# Patient Record
Sex: Female | Born: 1935 | Race: White | Hispanic: No | Marital: Single | Smoking: Never smoker
Health system: Southern US, Community
[De-identification: ages and names within clinical notes are randomized; demographics above are authoritative.]

## PROBLEM LIST (undated history)

## (undated) DIAGNOSIS — E785 Hyperlipidemia, unspecified: Secondary | ICD-10-CM

## (undated) DIAGNOSIS — I10 Essential (primary) hypertension: Secondary | ICD-10-CM

## (undated) DIAGNOSIS — C4490 Unspecified malignant neoplasm of skin, unspecified: Secondary | ICD-10-CM

---

## 2013-10-03 DIAGNOSIS — I219 Acute myocardial infarction, unspecified: Secondary | ICD-10-CM

## 2013-10-03 HISTORY — DX: Acute myocardial infarction, unspecified: I21.9

## 2021-11-24 ENCOUNTER — Encounter (HOSPITAL_COMMUNITY): Payer: Self-pay | Admitting: Cardiology

## 2021-11-24 ENCOUNTER — Emergency Department (HOSPITAL_BASED_OUTPATIENT_CLINIC_OR_DEPARTMENT_OTHER): Payer: Federal, State, Local not specified - PPO

## 2021-11-24 ENCOUNTER — Observation Stay (HOSPITAL_COMMUNITY)
Admission: EM | Admit: 2021-11-24 | Discharge: 2021-11-25 | Disposition: A | Discharge status: Home / Self Care | Payer: Federal, State, Local not specified - PPO | Attending: Internal Medicine | Admitting: Internal Medicine

## 2021-11-24 ENCOUNTER — Other Ambulatory Visit: Payer: Self-pay

## 2021-11-24 ENCOUNTER — Emergency Department (HOSPITAL_COMMUNITY): Payer: Federal, State, Local not specified - PPO

## 2021-11-24 DIAGNOSIS — I10 Essential (primary) hypertension: Secondary | ICD-10-CM | POA: Diagnosis not present

## 2021-11-24 DIAGNOSIS — Z7982 Long term (current) use of aspirin: Secondary | ICD-10-CM | POA: Insufficient documentation

## 2021-11-24 DIAGNOSIS — Z79899 Other long term (current) drug therapy: Secondary | ICD-10-CM | POA: Diagnosis not present

## 2021-11-24 DIAGNOSIS — R0989 Other specified symptoms and signs involving the circulatory and respiratory systems: Secondary | ICD-10-CM | POA: Diagnosis present

## 2021-11-24 DIAGNOSIS — I4891 Unspecified atrial fibrillation: Secondary | ICD-10-CM

## 2021-11-24 DIAGNOSIS — Z85828 Personal history of other malignant neoplasm of skin: Secondary | ICD-10-CM | POA: Insufficient documentation

## 2021-11-24 DIAGNOSIS — Z20822 Contact with and (suspected) exposure to covid-19: Secondary | ICD-10-CM | POA: Insufficient documentation

## 2021-11-24 DIAGNOSIS — R778 Other specified abnormalities of plasma proteins: Secondary | ICD-10-CM | POA: Diagnosis not present

## 2021-11-24 DIAGNOSIS — I4819 Other persistent atrial fibrillation: Secondary | ICD-10-CM | POA: Diagnosis present

## 2021-11-24 HISTORY — DX: Essential (primary) hypertension: I10

## 2021-11-24 HISTORY — DX: Hyperlipidemia, unspecified: E78.5

## 2021-11-24 HISTORY — DX: Unspecified malignant neoplasm of skin, unspecified: C44.90

## 2021-11-24 LAB — BASIC METABOLIC PANEL
Anion gap: 12 (ref 5–15)
BUN: 20 mg/dL (ref 8–23)
CO2: 26 mmol/L (ref 22–32)
Calcium: 9.3 mg/dL (ref 8.9–10.3)
Chloride: 103 mmol/L (ref 98–111)
Creatinine, Ser: 0.91 mg/dL (ref 0.44–1.00)
GFR, Estimated: 59 mL/min — ABNORMAL LOW (ref >60)
Glucose, Bld: 103 mg/dL — ABNORMAL HIGH (ref 70–99)
Potassium: 3.8 mmol/L (ref 3.5–5.1)
Sodium: 141 mmol/L (ref 135–145)

## 2021-11-24 LAB — ECHOCARDIOGRAM COMPLETE
AR max vel: 2.51 cm2
AV Area VTI: 2.74 cm2
AV Area mean vel: 2.32 cm2
AV Mean grad: 3 mmHg
AV Peak grad: 6.1 mmHg
Ao pk vel: 1.23 m/s
Area-P 1/2: 2.29 cm2
Height: 64 in
MV VTI: 1.81 cm2
S' Lateral: 2.38 cm
Weight: 2320 oz

## 2021-11-24 LAB — RESP PANEL BY RT-PCR (FLU A&B, COVID) ARPGX2
Influenza A by PCR: NEGATIVE
Influenza B by PCR: NEGATIVE
SARS Coronavirus 2 by RT PCR: NEGATIVE

## 2021-11-24 LAB — CBC
HCT: 45.9 % (ref 36.0–46.0)
Hemoglobin: 15.2 g/dL — ABNORMAL HIGH (ref 12.0–15.0)
MCH: 34.3 pg — ABNORMAL HIGH (ref 26.0–34.0)
MCHC: 33.1 g/dL (ref 30.0–36.0)
MCV: 103.6 fL — ABNORMAL HIGH (ref 80.0–100.0)
Platelets: 166 10*3/uL (ref 150–400)
RBC: 4.43 MIL/uL (ref 3.87–5.11)
RDW: 13.1 % (ref 11.5–15.5)
WBC: 7.9 10*3/uL (ref 4.0–10.5)
nRBC: 0 % (ref 0.0–0.2)

## 2021-11-24 LAB — TROPONIN I (HIGH SENSITIVITY)
Troponin I (High Sensitivity): 500 ng/L (ref <18)
Troponin I (High Sensitivity): 469 ng/L (ref <18)

## 2021-11-24 LAB — MAGNESIUM: Magnesium: 2 mg/dL (ref 1.7–2.4)

## 2021-11-24 MED ORDER — AMIODARONE HCL IN DEXTROSE 360-4.14 MG/200ML-% IV SOLN: 30.0000 mg/h | INTRAVENOUS | Status: DC

## 2021-11-24 MED ORDER — HEPARIN BOLUS VIA INFUSION
4000.0000 [IU] | Freq: Once | INTRAVENOUS | Status: AC
  Administered 2021-11-24: 4000 [IU] via INTRAVENOUS
  Filled 2021-11-24: qty 4000

## 2021-11-24 MED ORDER — ADULT MULTIVITAMIN W/MINERALS CH
1.0000 | ORAL_TABLET | Freq: Every day | ORAL | Status: DC
  Administered 2021-11-25: 1 via ORAL
  Filled 2021-11-24: qty 1

## 2021-11-24 MED ORDER — AMIODARONE HCL IN DEXTROSE 360-4.14 MG/200ML-% IV SOLN
60.0000 mg/h | INTRAVENOUS | Status: DC
  Administered 2021-11-24: 60 mg/h via INTRAVENOUS
  Filled 2021-11-24: qty 200

## 2021-11-24 MED ORDER — ONDANSETRON HCL 4 MG PO TABS
4.0000 mg | ORAL_TABLET | Freq: Four times a day (QID) | ORAL | Status: DC | PRN
Start: 2021-11-24 — End: 2021-11-25

## 2021-11-24 MED ORDER — APIXABAN 5 MG PO TABS
5.0000 mg | ORAL_TABLET | Freq: Two times a day (BID) | ORAL | Status: DC
  Administered 2021-11-24 – 2021-11-25 (×2): 5 mg via ORAL
  Filled 2021-11-24 (×2): qty 1

## 2021-11-24 MED ORDER — ACETAMINOPHEN 650 MG RE SUPP: 650.0000 mg | Freq: Four times a day (QID) | RECTAL | Status: DC | PRN

## 2021-11-24 MED ORDER — ASPIRIN 325 MG PO TABS: 325.0000 mg | ORAL_TABLET | Freq: Every day | ORAL | Status: DC

## 2021-11-24 MED ORDER — AMIODARONE HCL 200 MG PO TABS
200.0000 mg | ORAL_TABLET | Freq: Two times a day (BID) | ORAL | Status: DC
  Administered 2021-11-24 – 2021-11-25 (×3): 200 mg via ORAL
  Filled 2021-11-24 (×3): qty 1

## 2021-11-24 MED ORDER — ONDANSETRON HCL 4 MG/2ML IJ SOLN: 4.0000 mg | Freq: Four times a day (QID) | INTRAMUSCULAR | Status: DC | PRN

## 2021-11-24 MED ORDER — AMIODARONE LOAD VIA INFUSION
150.0000 mg | Freq: Once | INTRAVENOUS | Status: AC
  Administered 2021-11-24: 150 mg via INTRAVENOUS
  Filled 2021-11-24: qty 83.34

## 2021-11-24 MED ORDER — ACETAMINOPHEN 325 MG PO TABS: 650.0000 mg | ORAL_TABLET | Freq: Four times a day (QID) | ORAL | Status: DC | PRN

## 2021-11-24 MED ORDER — SODIUM CHLORIDE 0.9 % IV BOLUS
500.0000 mL | Freq: Once | INTRAVENOUS | Status: AC
  Administered 2021-11-24: 500 mL via INTRAVENOUS

## 2021-11-24 MED ORDER — DILTIAZEM LOAD VIA INFUSION
20.0000 mg | Freq: Once | INTRAVENOUS | Status: AC
  Administered 2021-11-24: 20 mg via INTRAVENOUS
  Filled 2021-11-24: qty 20

## 2021-11-24 MED ORDER — HEPARIN (PORCINE) 25000 UT/250ML-% IV SOLN
950.0000 [IU]/h | INTRAVENOUS | Status: DC
Start: 2021-11-24 — End: 2021-11-24
  Administered 2021-11-24: 950 [IU]/h via INTRAVENOUS
  Filled 2021-11-24 (×2): qty 250

## 2021-11-24 MED ORDER — DILTIAZEM HCL-DEXTROSE 125-5 MG/125ML-% IV SOLN (PREMIX)
5.0000 mg/h | INTRAVENOUS | Status: DC
  Administered 2021-11-24: 5 mg/h via INTRAVENOUS
  Filled 2021-11-24: qty 125

## 2021-11-24 MED ORDER — METOPROLOL SUCCINATE ER 25 MG PO TB24
25.0000 mg | ORAL_TABLET | Freq: Every day | ORAL | Status: DC
  Administered 2021-11-25: 25 mg via ORAL
  Filled 2021-11-24: qty 1

## 2021-11-24 NOTE — Progress Notes (Addendum)
Pt admitted from ED, VSS, CHG complete, oriented to unit, Cardiology notified, tele started.   Chrisandra Carota, RN 11/24/2021 6:36 PM

## 2021-11-24 NOTE — H&P (Signed)
Date: 11/24/2021               Patient Name:  Kristie Gillespie MRN: [id number]  DOB: [birth year] Age / Sex: 88 y.o., female   PCP: Pcp, No         Medical Service: Internal Medicine Teaching Service         Attending Physician: Dr. Velna Ochs, MD    First Contact: Delene Ruffini, MD Pager: [phone]  Second Contact: Rick Duff, MD Pager: [phone]       After Hours (After 5p/  First Contact Pager: [phone]  weekends / holidays): Second Contact Pager: [phone]   SUBJECTIVE   Chief Complaint: atrial fibrillation  History of Present Illness:  88 year old female with a hx of HTN, HLD, MI in 2015 managed medically, no hx of CABG or stents, who presents for new onset a-fib. Yesterday she felt a little weak requesting assistance with ambulation and had been experiencing burning in throat ad diaphoresis last night which almost prompted her to present to ED given hx of MI. She was seen in her PCP office today for routine follow up of HTN. She was asymptomatic, denied palpitations, chest pain, or dyspnea. She was noted to be hypertensive and tachycardic during office visit with BP (129-160)/(109-116) and tachycardic up to 160.  EKG was obtained and showed new afib with RVR and concerning ischemic changes. She was given aspirin 325 and transported to ED via EMS to [facility]. Given cardizem by EMS with no improvemetn.  ED Course:  Troponins were noted to be elevated at 500. Started on heparin and cardiology consulted. Given Cardizem bolus with impprovement in HR, but BP drops so she received 500cc NS bolus. Cardizem drip stopped and amio blus and drip ordered.. Patient admitted to medicine service with cardiology as consult.   Meds:  Current Meds  Medication Sig   acetaminophen (TYLENOL) 650 MG CR tablet Take 650 mg by mouth at bedtime.   aspirin EC 81 MG tablet Take 81 mg by mouth daily. Swallow whole.   DM-APAP-CPM (CORICIDIN HBP PO) Take 1 tablet by mouth daily. 10mg    felodipine  (PLENDIL) 10 MG 24 hr tablet Take 20 mg by mouth daily.   fenoprofen (NALFON) 600 MG TABS tablet Take 600 mg by mouth daily.   furosemide (LASIX) 20 MG tablet Take 20 mg by mouth daily.   irbesartan (AVAPRO) 300 MG tablet Take 300 mg by mouth daily.   KLOR-CON M20 20 MEQ tablet Take 20 mEq by mouth daily.   Lactobacillus Rhamnosus, GG, (CULTURELLE PO) Take 1 capsule by mouth daily.   metoprolol tartrate (LOPRESSOR) 50 MG tablet Take 50 mg by mouth 2 (two) times daily.   Multiple Vitamin (MULTIVITAMIN WITH MINERALS) TABS tablet Take 1 tablet by mouth daily.   simvastatin (ZOCOR) 20 MG tablet Take 20 mg by mouth at bedtime.   vitamin B-12 (CYANOCOBALAMIN) 1000 MCG tablet Take 1,000 mcg by mouth daily.    Past Medical History:  Diagnosis Date   HLD (hyperlipidemia)    HTN (hypertension)    MI (myocardial infarction) ([place]) 2015   Managed medically   Skin cancer     History reviewed. No pertinent surgical history.  Social:  Lives With: none Occupation: retired Network engineer  Support: daughter Level of Function: independent PCP: Jossie Ng Substances: none   Allergies: Allergies as of 11/24/2021 - Review Complete 11/24/2021  Allergen Reaction Noted   Diltiazem Nausea Only 11/24/2021   Prednisone Other (See Comments) 11/24/2021  Amoxicillin Rash 11/24/2021   Tetracyclines & related Rash 11/24/2021    Review of Systems: A complete ROS was negative except as per HPI.   OBJECTIVE:   Physical Exam: Blood pressure 101/70, pulse 60, temperature (!) 97.5 F (36.4 C), resp. rate (!) 24, height 5\' 4"  (1.626 m), weight 65.8 kg, SpO2 97 %.  Constitutional: well-appearing female sitting in bed, in no acute distress HENT: normocephalic atraumatic, mucous membranes moist Eyes: conjunctiva non-erythematous Neck: supple Cardiovascular: regular rate and rhythm, no m/r/g Pulmonary/Chest: normal work of breathing on room air, lungs clear to auscultation bilaterally Abdominal: soft,  non-tender, non-distended MSK: normal bulk and tone Neurological: alert & oriented x 3, 5/5 strength in bilateral upper and lower extremities, normal gait Skin: warm and dry Psych: mood and affect appropriate  Labs: CBC    Component Value Date/Time   WBC 7.9 11/24/2021 1038   RBC 4.43 11/24/2021 1038   HGB 15.2 (H) 11/24/2021 1038   HCT 45.9 11/24/2021 1038   PLT 166 11/24/2021 1038   MCV 103.6 (H) 11/24/2021 1038   MCH 34.3 (H) 11/24/2021 1038   MCHC 33.1 11/24/2021 1038   RDW 13.1 11/24/2021 1038     CMP     Component Value Date/Time   NA 141 11/24/2021 1038   K 3.8 11/24/2021 1038   CL 103 11/24/2021 1038   CO2 26 11/24/2021 1038   GLUCOSE 103 (H) 11/24/2021 1038   BUN 20 11/24/2021 1038   CREATININE 0.91 11/24/2021 1038   CALCIUM 9.3 11/24/2021 1038   GFRNONAA 59 (L) 11/24/2021 1038    Imaging: DG Chest Port 1 View  Result Date: 11/24/2021 CLINICAL DATA:  Atrial fibrillation EXAM: PORTABLE CHEST 1 VIEW COMPARISON:  09/11/2018 FINDINGS: The heart size and mediastinal contours are within normal limits. Aortic atherosclerosis. Chronically coarsened interstitial markings bilaterally. No focal airspace consolidation, pleural effusion, or pneumothorax. The visualized skeletal structures are unremarkable. IMPRESSION: 1. No acute cardiopulmonary findings. 2. Chronic bronchitic type lung changes. Electronically Signed   By: Davina Poke D.O.   On: 11/24/2021 10:43   ECHOCARDIOGRAM COMPLETE  Result Date: 11/24/2021    ECHOCARDIOGRAM REPORT   Patient Name:   [facility] Date of Exam: 11/24/2021 Medical Rec #:  [id number]         Height:       64.0 in Accession #:    [id number]        Weight:       145.0 lb Date of Birth:  Jul 13, 1926          BSA:          1.706 m Patient Age:    86 years          BP:           125/111 mmHg Patient Gender: F                 HR:           131 bpm. Exam Location:  Inpatient Procedure: 2D Echo Indications:    Atrial fibrillation  History:         Patient has no prior history of Echocardiogram examinations.                 Previous Myocardial Infarction; Risk Factors:Dyslipidemia and                 Hypertension.  Sonographer:    Arlyss Gandy Referring Phys: [id number] Margie Billet  Sonographer Comments: Supine. IMPRESSIONS  1.  Left ventricular ejection fraction, by estimation, is 60 to 65%. The left ventricle has normal function. The left ventricle has no regional wall motion abnormalities. Left ventricular diastolic parameters are consistent with Grade II diastolic dysfunction (pseudonormalization).  2. Right ventricular systolic function is normal. The right ventricular size is normal. There is normal pulmonary artery systolic pressure. The estimated right ventricular systolic pressure is 81.8 mmHg.  3. The mitral valve is normal in structure. Mild mitral valve regurgitation. No evidence of mitral stenosis. Moderate mitral annular calcification.  4. Tricuspid valve regurgitation is mild to moderate.  5. The aortic valve is normal in structure. Aortic valve regurgitation is not visualized. No aortic stenosis is present.  6. The inferior vena cava is normal in size with greater than 50% respiratory variability, suggesting right atrial pressure of 3 mmHg. FINDINGS  Left Ventricle: Left ventricular ejection fraction, by estimation, is 60 to 65%. The left ventricle has normal function. The left ventricle has no regional wall motion abnormalities. The left ventricular internal cavity size was normal in size. There is  no left ventricular hypertrophy. Left ventricular diastolic parameters are consistent with Grade II diastolic dysfunction (pseudonormalization). Right Ventricle: The right ventricular size is normal. No increase in right ventricular wall thickness. Right ventricular systolic function is normal. There is normal pulmonary artery systolic pressure. The tricuspid regurgitant velocity is 2.23 m/s, and  with an assumed right atrial pressure of 3  mmHg, the estimated right ventricular systolic pressure is 56.3 mmHg. Left Atrium: Left atrial size was normal in size. Right Atrium: Right atrial size was normal in size. Pericardium: There is no evidence of pericardial effusion. Mitral Valve: The mitral valve is normal in structure. Moderate mitral annular calcification. Mild mitral valve regurgitation. No evidence of mitral valve stenosis. MV peak gradient, 8.2 mmHg. The mean mitral valve gradient is 3.0 mmHg. Tricuspid Valve: The tricuspid valve is normal in structure. Tricuspid valve regurgitation is mild to moderate. No evidence of tricuspid stenosis. Aortic Valve: The aortic valve is normal in structure. Aortic valve regurgitation is not visualized. No aortic stenosis is present. Aortic valve mean gradient measures 3.0 mmHg. Aortic valve peak gradient measures 6.1 mmHg. Aortic valve area, by VTI measures 2.74 cm. Pulmonic Valve: The pulmonic valve was normal in structure. Pulmonic valve regurgitation is not visualized. No evidence of pulmonic stenosis. Aorta: The aortic root is normal in size and structure. Venous: The inferior vena cava is normal in size with greater than 50% respiratory variability, suggesting right atrial pressure of 3 mmHg. IAS/Shunts: No atrial level shunt detected by color flow Doppler.  LEFT VENTRICLE PLAX 2D LVIDd:         3.44 cm   Diastology LVIDs:         2.38 cm   LV e' medial:  5.78 cm/s LV PW:         1.20 cm   LV e' lateral: 6.90 cm/s LV IVS:        1.26 cm LVOT diam:     1.90 cm LV SV:         78 LV SV Index:   46 LVOT Area:     2.84 cm  RIGHT VENTRICLE RV Basal diam:  2.68 cm RV Mid diam:    2.28 cm RV S prime:     11.90 cm/s TAPSE (M-mode): 1.3 cm LEFT ATRIUM             Index        RIGHT ATRIUM  Index LA diam:        3.50 cm 2.05 cm/m   RA Area:     15.20 cm LA Vol (A2C):   56.6 ml 33.17 ml/m  RA Volume:   34.20 ml  20.04 ml/m LA Vol (A4C):   48.8 ml 28.60 ml/m LA Biplane Vol: 52.8 ml 30.94 ml/m  AORTIC  VALVE AV Area (Vmax):    2.51 cm AV Area (Vmean):   2.32 cm AV Area (VTI):     2.74 cm AV Vmax:           123.00 cm/s AV Vmean:          88.900 cm/s AV VTI:            0.284 m AV Peak Grad:      6.1 mmHg AV Mean Grad:      3.0 mmHg LVOT Vmax:         109.00 cm/s LVOT Vmean:        72.900 cm/s LVOT VTI:          0.274 m LVOT/AV VTI ratio: 0.96  AORTA Ao Root diam: 2.70 cm Ao Asc diam:  3.00 cm MITRAL VALVE              TRICUSPID VALVE MV Area (PHT): 2.29 cm   TR Peak grad:   19.9 mmHg MV Area VTI:   1.81 cm   TR Vmax:        223.00 cm/s MV Peak grad:  8.2 mmHg MV Mean grad:  3.0 mmHg   SHUNTS MV Vmax:       1.43 m/s   Systemic VTI:  0.27 m MV Vmean:      75.6 cm/s  Systemic Diam: 1.90 cm Candee Furbish MD Electronically signed by Candee Furbish MD Signature Date/Time: 11/24/2021/3:47:43 PM    Final     EKG: personally reviewed my interpretation is normal sinus   ASSESSMENT & PLAN:    Assessment & Plan by Problem: Principal Problem:   A-fib (HCC) Active Problems:   New onset a-fib (HCC)  A fib with diffuse ST-T wave abnormalities HX of MI 2015 medically managed, no stents or CABG.  CHADSVASC 4.  - converted on IV amio and now in NSR. Switched to 200mg  PO BID.  - metop succinate 25mg  for rate control.  - IV heparin in ED switched to Eliquis 5mg  BID. Not recommended she be on aspirin given advanced age.  - elevated troponin peak at 500, trending down now 469 felt to be demand ischemia in the setting of afib with RVR - TSH  HTN  Blood pressure has been stable. Holding felodipine, lasix and irbesartan for now.   HLD - statin therapy  Dispo: Admit patient to Observation with expected length of stay less than 2 midnights.  Signed: Delene Ruffini, MD Internal Medicine Resident PGY-1 Pager: [phone]  11/24/2021, 8:04 PM

## 2021-11-24 NOTE — ED Triage Notes (Signed)
PT BIB [facility] with complaint of new onset of AFIB found during an office visit with doctor

## 2021-11-24 NOTE — Progress Notes (Signed)
Echocardiogram 2D Echocardiogram has been performed.  Arlyss Gandy 11/24/2021, 3:35 PM

## 2021-11-24 NOTE — ED Triage Notes (Signed)
PT was given 325 ASA from provider prior to EMS pick up , PT was then given 10 mg of cardizem while in ambulance.

## 2021-11-24 NOTE — ED Notes (Signed)
Pt found to be in NSR. EKG obtained. MD notified.

## 2021-11-24 NOTE — Progress Notes (Addendum)
Cardiology Consultation:   Patient ID: Kristie Gillespie MRN: [id number]; DOB: [birth year]  Admit date: 11/24/2021 Date of Consult: 11/24/2021  PCP:  Merryl Hacker, No   [facility] Providers Cardiologist:  None   {    Patient Profile:   Kristie Gillespie is a 88 y.o. female with a hx of HTN, CKD stage IIIa, skin cancer, HLD, CAD with a history of MI in 2015 (no stents or CABG) who is being seen 11/24/2021 for the evaluation of Afib with RVR at the request of Dr. Gilford Raid.  History of Present Illness:   Kristie Gillespie is a 88 year old female with above medical history.  Per chart review, patient had an MI in 2015 that was managed medically.  There are no records of MI available.  No documented echocardiograms, ischemic eval's. On interview, patient denies any cardiac history except for 2015 MI.   Patient presented to the ED on 2/22.  Patient had an office visit with her primary care provider earlier today and during that visit, an EKG showed afib with RVR and concerning ischemic changes on EKG. Patient was sent to [facility] ED for further workup.   Labs in the ED showed Na 141, K 3.8, creatinine 0.91, hemoglobin 15.2, WBC 7.9. HSTN 500>>469. CXR showed no acute cardiopulmonary findings. EKG showed atrial fibrillation with a HR of 158. Patient was started on IV heparin, IV diltiazem. Diltiazem was discontinued due to low BP.  On interview, patient denies any symptoms of atrial fibrillation. Reports that she does not feel any different than normal, but was told by her PCP that she had to come to the hospital. Daughter at bedside  reports that the patient has been in her usual state of health and has not been complaining of any symptoms. No recent falls, dizziness, chest pain, palpitations, dizziness. Patient had an MI in 83 while living in [place], was managed medically. Denies any history of heart failure. Has never been diagnosed with afib in the past.   Past Medical History:  Diagnosis Date   HLD  (hyperlipidemia)    HTN (hypertension)    MI (myocardial infarction) ([place]) 2015   Managed medically   Skin cancer     History reviewed. No pertinent surgical history.   Home Medications:  Prior to Admission medications   Not on File    Inpatient Medications: Scheduled Meds:   Continuous Infusions:  amiodarone 60 mg/hr (11/24/21 1349)   Followed by   amiodarone     heparin 950 Units/hr (11/24/21 1351)   PRN Meds:   Allergies:    Allergies  Allergen Reactions   Diltiazem Nausea Only   Prednisone Other (See Comments)   Amoxicillin Rash   Tetracyclines & Related Rash    Social History:   Social History   Socioeconomic History   Marital status: Single    Spouse name: Not on file   Number of children: Not on file   Years of education: Not on file   Highest education level: Not on file  Occupational History   Not on file  Tobacco Use   Smoking status: Not on file   Smokeless tobacco: Not on file  Substance and Sexual Activity   Alcohol use: Not on file   Drug use: Not on file   Sexual activity: Not on file  Other Topics Concern   Not on file  Social History Narrative   Not on file   Social Determinants of Health   Financial Resource Strain: Not on file  Food Insecurity: Not on file  Transportation Needs: Not on file  Physical Activity: Not on file  Stress: Not on file  Social Connections: Not on file  Intimate Partner Violence: Not on file    Family History:   No family history on file.   ROS:  Please see the history of present illness.   All other ROS reviewed and negative.     Physical Exam/Data:   Vitals:   11/24/21 1210 11/24/21 1220 11/24/21 1250 11/24/21 1300  BP: (!) 119/100 114/87 (!) 115/100 118/79  Pulse: (!) 152 91 (!) 131 (!) 159  Resp: (!) 24 20 (!) 31 20  Temp:      SpO2: 95% 96% 97% 96%  Weight:      Height:       No intake or output data in the 24 hours ending 11/24/21 1419 Last 3 Weights 11/24/2021  Weight (lbs) 145  lb  Weight (kg) 65.772 kg     Body mass index is 24.89 kg/m.  General:  Elderly woman in no acute distress HEENT: normal Neck: no JVD Vascular: Radial pulses 2+ bilaterally Cardiac:  normal S1, S2; irregular rate and rhythm, tachycardic; no murmur  Lungs:  clear to auscultation bilaterally, no wheezing, rhonchi or rales  Abd: soft, nontender, no hepatomegaly  Ext: no edema Musculoskeletal:  No deformities Skin: warm and dry  Neuro:  CNs 2-12 intact, no focal abnormalities noted Psych:  Normal affect   EKG:  The EKG was personally reviewed and demonstrates:  EKG showed atrial fibrillation with a HR of 158 Telemetry:  Telemetry was personally reviewed and demonstrates:  Atrial fibrillation, HR in the 140s-160s  Relevant CV Studies:   Laboratory Data:  High Sensitivity Troponin:   Recent Labs  Lab 11/24/21 1038 11/24/21 1220  TROPONINIHS 500* 469*     Chemistry Recent Labs  Lab 11/24/21 1038  NA 141  K 3.8  CL 103  CO2 26  GLUCOSE 103*  BUN 20  CREATININE 0.91  CALCIUM 9.3  MG 2.0  GFRNONAA 59*  ANIONGAP 12    No results for input(s): PROT, ALBUMIN, AST, ALT, ALKPHOS, BILITOT in the last 168 hours. Lipids No results for input(s): CHOL, TRIG, HDL, LABVLDL, LDLCALC, CHOLHDL in the last 168 hours.  Hematology Recent Labs  Lab 11/24/21 1038  WBC 7.9  RBC 4.43  HGB 15.2*  HCT 45.9  MCV 103.6*  MCH 34.3*  MCHC 33.1  RDW 13.1  PLT 166   Thyroid No results for input(s): TSH, FREET4 in the last 168 hours.  BNPNo results for input(s): BNP, PROBNP in the last 168 hours.  DDimer No results for input(s): DDIMER in the last 168 hours.   Radiology/Studies:  DG Chest Port 1 View  Result Date: 11/24/2021 CLINICAL DATA:  Atrial fibrillation EXAM: PORTABLE CHEST 1 VIEW COMPARISON:  09/11/2018 FINDINGS: The heart size and mediastinal contours are within normal limits. Aortic atherosclerosis. Chronically coarsened interstitial markings bilaterally. No focal airspace  consolidation, pleural effusion, or pneumothorax. The visualized skeletal structures are unremarkable. IMPRESSION: 1. No acute cardiopulmonary findings. 2. Chronic bronchitic type lung changes. Electronically Signed   By: Davina Poke D.O.   On: 11/24/2021 10:43     Assessment and Plan:   Atrial Fibrillation with RVR: CHADS-VASc 4 (age x2, gender, HTN)  - HSTN 500>>469, likely demand ischemia in the setting of tachycardia. Will continue to trend troponin - Patient started on IV heparin in the ED. Plan to transition to oral [facility] while admitted  -  Was initially started on IV diltiazem, but the patient has low BP so it was discontinued.  - Unlikely that BP will be able to tolerate BB. Started IV amiodarone bolus and infusion   - Ordered echocardiogram  - Keep on telemetry  - As patient is asymptomatic, unsure how long patient has been in afib. If patient does require a cardioversion, will need TEE prior.  - Will order TSH   HTN  - Patient reports taking metoprolol, irbesartan for BP management  - Holding BP meds in the setting of low BP  - As BP tolerates, plan to add back on medications. Restart metoprolol before restarting irbesartan as metoprolol will help with rate control   HLD  - Continue simvastatin   CAD  - MI in 2015, managed medically  - Continue ASA, statin - BB on hold for now, will restart when able   Risk Assessment/Risk Scores:      CHA2DS2-VASc Score = 4  This indicates a 4.8% annual risk of stroke. The patient's score is based upon: CHF History: 0 HTN History: 1 Diabetes History: 0 Stroke History: 0 Vascular Disease History: 0 Age Score: 2 Gender Score: 1         For questions or updates, please contact [place] Please consult [email or web address] for contact info under    Signed, Margie Billet, PA-C  11/24/2021 2:19 PM

## 2021-11-24 NOTE — Progress Notes (Signed)
ANTICOAGULATION CONSULT NOTE - Initial Consult  Pharmacy Consult for Heparin Indication: atrial fibrillation  Allergies  Allergen Reactions   Diltiazem Nausea Only   Prednisone Other (See Comments)   Amoxicillin Rash   Tetracyclines & Related Rash    Patient Measurements: Height: 5\' 4"  (162.6 cm) Weight: 65.8 kg (145 lb) IBW/kg (Calculated) : 54.7 Heparin Dosing Weight: 65.8 kg   Vital Signs: Temp: 97.5 F (36.4 C) (02/22 1023) BP: 114/87 (02/22 1220) Pulse Rate: 91 (02/22 1220)  Labs: Recent Labs    11/24/21 1038  HGB 15.2*  HCT 45.9  PLT 166  CREATININE 0.91  TROPONINIHS 500*    Estimated Creatinine Clearance: 36 mL/min (by C-G formula based on SCr of 0.91 mg/dL).   Medical History: No past medical history on file.  Assessment: 88 yo female presented on 11/24/2021 with new onset Afib. No AC prior to admission. Hgb 15.2. Plt wnl. Trop 500.   Goal of Therapy:  Heparin level 0.3-0.7 units/ml Monitor platelets by anticoagulation protocol: Yes   Plan:  Heparin 4000 units x1 bolus followed by 950 units/hr  Check 8 hr heparin level Monitor heparin level, CBC and s/s of bleeding   Cristela Felt, PharmD, BCPS Clinical Pharmacist 11/24/2021 12:40 PM

## 2021-11-24 NOTE — ED Provider Notes (Signed)
[facility] EMERGENCY DEPARTMENT Provider Note   CSN: [id number] Arrival date & time: 11/24/21  1014     History  Chief Complaint  Patient presents with   Atrial Fibrillation    Kristie Gillespie is a 88 y.o. female.  Pt is a 88 yo wf with a hx of skin cancer, MI (did not require a stent), high cholesterol, and htn.  She went to the doctor today for a routine f/u for her htn.  She did note that she felt a little weak yesterday and woke up in the night feeling some burning in her throat and was sweaty.  She does not have cp or sensation of palpitations.  Her doctor checked her HR and did an EKG which showed afib.  Pt has no hx of afib.  Her doctor called EMS and EMS gave her 10 mg of cardizem en route which did not help HR very much. Pt does not have a local cardiologist.  Cath for MI done in [place].      Home Medications Prior to Admission medications   Medication Sig Start Date End Date Taking? Authorizing Provider  acetaminophen (TYLENOL) 650 MG CR tablet Take 650 mg by mouth at bedtime.   Yes [provider]  aspirin EC 81 MG tablet Take 81 mg by mouth daily. Swallow whole.   Yes [provider]  DM-APAP-CPM (CORICIDIN HBP PO) Take 1 tablet by mouth daily. 10mg    Yes [provider]  felodipine (PLENDIL) 10 MG 24 hr tablet Take 20 mg by mouth daily. 11/16/21  Yes [provider]  fenoprofen (NALFON) 600 MG TABS tablet Take 600 mg by mouth daily.   Yes [provider]  furosemide (LASIX) 20 MG tablet Take 20 mg by mouth daily. 09/20/21  Yes [provider]  irbesartan (AVAPRO) 300 MG tablet Take 300 mg by mouth daily. 09/17/21  Yes [provider]  KLOR-CON M20 20 MEQ tablet Take 20 mEq by mouth daily. 11/21/21  Yes [provider]  Lactobacillus Rhamnosus, GG, (CULTURELLE PO) Take 1 capsule by mouth daily.   Yes [provider]  metoprolol tartrate (LOPRESSOR) 50 MG tablet Take 50 mg  by mouth 2 (two) times daily. 09/22/21  Yes [provider]  Multiple Vitamin (MULTIVITAMIN WITH MINERALS) TABS tablet Take 1 tablet by mouth daily.   Yes [provider]  simvastatin (ZOCOR) 20 MG tablet Take 20 mg by mouth at bedtime. 11/22/21  Yes [provider]  vitamin B-12 (CYANOCOBALAMIN) 1000 MCG tablet Take 1,000 mcg by mouth daily.   Yes [provider]      Allergies    Diltiazem, Prednisone, Amoxicillin, and Tetracyclines & related    Review of Systems   Review of Systems  Neurological:  Positive for weakness.  All other systems reviewed and are negative.  Physical Exam Updated Vital Signs BP 129/67    Pulse 72    Temp (!) 97.5 F (36.4 C)    Resp 14    Ht 5\' 4"  (1.626 m)    Wt 65.8 kg    SpO2 96%    BMI 24.89 kg/m  Physical Exam Vitals and nursing note reviewed.  Constitutional:      Appearance: Normal appearance.  HENT:     Head: Normocephalic and atraumatic.     Right Ear: External ear normal.     Left Ear: External ear normal.     Nose: Nose normal.     Mouth/Throat:  Mouth: Mucous membranes are moist.     Pharynx: Oropharynx is clear.  Eyes:     Extraocular Movements: Extraocular movements intact.     Conjunctiva/sclera: Conjunctivae normal.     Pupils: Pupils are equal, round, and reactive to light.  Cardiovascular:     Rate and Rhythm: Tachycardia present. Rhythm irregular.     Pulses: Normal pulses.     Heart sounds: Normal heart sounds.  Pulmonary:     Effort: Pulmonary effort is normal.     Breath sounds: Normal breath sounds.  Abdominal:     General: Abdomen is flat. Bowel sounds are normal.     Palpations: Abdomen is soft.  Musculoskeletal:        General: Normal range of motion.     Cervical back: Normal range of motion and neck supple.  Skin:    General: Skin is warm.     Capillary Refill: Capillary refill takes less than 2 seconds.  Neurological:     General: No focal deficit present.     Mental  Status: She is alert and oriented to person, place, and time.  Psychiatric:        Mood and Affect: Mood normal.        Behavior: Behavior normal.    ED Results / Procedures / Treatments   Labs (all labs ordered are listed, but only abnormal results are displayed) Labs Reviewed  BASIC METABOLIC PANEL - Abnormal; Notable for the following components:      Result Value   Glucose, Bld 103 (*)    GFR, Estimated 59 (*)    All other components within normal limits  CBC - Abnormal; Notable for the following components:   Hemoglobin 15.2 (*)    MCV 103.6 (*)    MCH 34.3 (*)    All other components within normal limits  TROPONIN I (HIGH SENSITIVITY) - Abnormal; Notable for the following components:   Troponin I (High Sensitivity) 500 (*)    All other components within normal limits  TROPONIN I (HIGH SENSITIVITY) - Abnormal; Notable for the following components:   Troponin I (High Sensitivity) 469 (*)    All other components within normal limits  RESP PANEL BY RT-PCR (FLU A&B, COVID) ARPGX2  MAGNESIUM  TSH  PROTIME-INR  ALT    EKG EKG Interpretation  Date/Time:  Wednesday November 24 2021 14:45:52 EST Ventricular Rate:  69 PR Interval:  204 QRS Duration: 88 QT Interval:  402 QTC Calculation: 431 R Axis:   42 Text Interpretation: Sinus rhythm Minimal ST depression, lateral leads now in nsr Confirmed by Isla Pence [phone]) on 11/24/2021 2:47:07 PM  Radiology DG Chest Port 1 View  Result Date: 11/24/2021 CLINICAL DATA:  Atrial fibrillation EXAM: PORTABLE CHEST 1 VIEW COMPARISON:  09/11/2018 FINDINGS: The heart size and mediastinal contours are within normal limits. Aortic atherosclerosis. Chronically coarsened interstitial markings bilaterally. No focal airspace consolidation, pleural effusion, or pneumothorax. The visualized skeletal structures are unremarkable. IMPRESSION: 1. No acute cardiopulmonary findings. 2. Chronic bronchitic type lung changes. Electronically Signed    By: Davina Poke D.O.   On: 11/24/2021 10:43    Procedures Procedures    Medications Ordered in ED Medications  metoprolol succinate (TOPROL-XL) 24 hr tablet 25 mg (has no administration in time range)  amiodarone (PACERONE) tablet 200 mg (has no administration in time range)  apixaban (ELIQUIS) tablet 5 mg (has no administration in time range)  diltiazem (CARDIZEM) 1 mg/mL load via infusion 20 mg (20 mg Intravenous Bolus from  Bag 11/24/21 1049)  sodium chloride 0.9 % bolus 500 mL (0 mLs Intravenous Stopped 11/24/21 1352)  heparin bolus via infusion 4,000 Units (4,000 Units Intravenous Bolus from Bag 11/24/21 1351)  amiodarone (NEXTERONE) 1.8 mg/mL load via infusion 150 mg (150 mg Intravenous Bolus from Bag 11/24/21 1346)    ED Course/ Medical Decision Making/ A&P                           Medical Decision Making Amount and/or Complexity of Data Reviewed Labs: ordered. Radiology: ordered.  Risk Prescription drug management. Decision regarding hospitalization.   This patient presents to the ED for concern of new onset a. fib, this involves an extensive number of treatment options, and is a complaint that carries with it a high risk of complications and morbidity.  The differential diagnosis includes cardiovascular disorder, electrolyte abn, mi   Co morbidities that complicate the patient evaluation  Htn, high cholesterol, and hx MI   Additional history obtained:  Additional history obtained from epic chart review External records from outside source obtained and reviewed including daughter   Lab Tests:  I Ordered, and personally interpreted labs.  The pertinent results include:  cbc and bmp nl.  Troponin elevated at 500.   Imaging Studies ordered:  I ordered imaging studies including cxr  I independently visualized and interpreted imaging which showed chronic bronchitis I agree with the radiologist interpretation   Cardiac Monitoring:  The patient was  maintained on a cardiac monitor.  I personally viewed and interpreted the cardiac monitored which showed an underlying rhythm of: afib with rvr   Medicines ordered and prescription drug management:  I ordered medication including cardizem bolus and drip/heparin  for afib  Reevaluation of the patient after these medicines showed that the patient improved I have reviewed the patients home medicines and have made adjustments as needed     Critical Interventions:  Asa given by pcp, heparin started as chadvasc is 5   Consultations Obtained:  I requested consultation with cardiology,  and discussed lab and imaging findings as well as pertinent plan - they recommend: admission to the medicine service.  They will consult. Pt d/w IMTS for admission.   Problem List / ED Course:  Atrial fib:  pt started on cardizem.  It has helped hr, but hr is still elevated.  BP did drop, so she was given 500 cc ns bolus.  Bp remains soft, so we can't titrate cardizem up.  Cardizem stopped and amiodarone bolus and drip was ordered.  Pt is not a candidate for cardioversion as she is unable to tell she is in afib.   Elevated troponin:  Likely demand ischemia.   Reevaluation:  After the interventions noted above, I reevaluated the patient and found that they have :improved     Dispostion:  After consideration of the diagnostic results and the patients response to treatment, I feel that the patent would benefit from admission.  CHA2DS2/VAS Stroke Risk Points; 11 (age, female, hx htn, hx mi)           CRITICAL CARE Performed by: Isla Pence   Total critical care time: 30 minutes  Critical care time was exclusive of separately billable procedures and treating other patients.  Critical care was necessary to treat or prevent imminent or life-threatening deterioration.  Critical care was time spent personally by me on the following activities: development of treatment plan with patient and/or  surrogate as well as nursing,  discussions with consultants, evaluation of patient's response to treatment, examination of patient, obtaining history from patient or surrogate, ordering and performing treatments and interventions, ordering and review of laboratory studies, ordering and review of radiographic studies, pulse oximetry and re-evaluation of patient's condition.         Final Clinical Impression(s) / ED Diagnoses Final diagnoses:  Atrial fibrillation with RVR ([place])  Elevated troponin    Rx / DC Orders ED Discharge Orders     None         Isla Pence, MD 11/24/21 1547

## 2021-11-24 NOTE — Hospital Course (Signed)
° ° °  PMH:  HTN

## 2021-11-25 ENCOUNTER — Other Ambulatory Visit (HOSPITAL_COMMUNITY): Payer: Self-pay

## 2021-11-25 DIAGNOSIS — I480 Paroxysmal atrial fibrillation: Secondary | ICD-10-CM | POA: Diagnosis not present

## 2021-11-25 DIAGNOSIS — I10 Essential (primary) hypertension: Secondary | ICD-10-CM | POA: Diagnosis not present

## 2021-11-25 DIAGNOSIS — R778 Other specified abnormalities of plasma proteins: Secondary | ICD-10-CM | POA: Diagnosis not present

## 2021-11-25 LAB — CBC
HCT: 37 % (ref 36.0–46.0)
Hemoglobin: 12.4 g/dL (ref 12.0–15.0)
MCH: 34.1 pg — ABNORMAL HIGH (ref 26.0–34.0)
MCHC: 33.5 g/dL (ref 30.0–36.0)
MCV: 101.6 fL — ABNORMAL HIGH (ref 80.0–100.0)
Platelets: 131 10*3/uL — ABNORMAL LOW (ref 150–400)
RBC: 3.64 MIL/uL — ABNORMAL LOW (ref 3.87–5.11)
RDW: 13.1 % (ref 11.5–15.5)
WBC: 7.6 10*3/uL (ref 4.0–10.5)
nRBC: 0 % (ref 0.0–0.2)

## 2021-11-25 LAB — COMPREHENSIVE METABOLIC PANEL
ALT: 18 U/L (ref 0–44)
AST: 27 U/L (ref 15–41)
Albumin: 3.3 g/dL — ABNORMAL LOW (ref 3.5–5.0)
Alkaline Phosphatase: 60 U/L (ref 38–126)
Anion gap: 11 (ref 5–15)
BUN: 16 mg/dL (ref 8–23)
CO2: 24 mmol/L (ref 22–32)
Calcium: 8.6 mg/dL — ABNORMAL LOW (ref 8.9–10.3)
Chloride: 105 mmol/L (ref 98–111)
Creatinine, Ser: 0.88 mg/dL (ref 0.44–1.00)
GFR, Estimated: >60 mL/min (ref >60)
Glucose, Bld: 100 mg/dL — ABNORMAL HIGH (ref 70–99)
Potassium: 3.3 mmol/L — ABNORMAL LOW (ref 3.5–5.1)
Sodium: 140 mmol/L (ref 135–145)
Total Bilirubin: 1 mg/dL (ref 0.3–1.2)
Total Protein: 5.4 g/dL — ABNORMAL LOW (ref 6.5–8.1)

## 2021-11-25 LAB — TSH: TSH: 3.169 u[IU]/mL (ref 0.350–4.500)

## 2021-11-25 LAB — VITAMIN B12: Vitamin B-12: 842 pg/mL (ref 180–914)

## 2021-11-25 LAB — FOLATE: Folate: 33.2 ng/mL (ref >5.9)

## 2021-11-25 LAB — MAGNESIUM: Magnesium: 1.8 mg/dL (ref 1.7–2.4)

## 2021-11-25 MED ORDER — POTASSIUM CHLORIDE CRYS ER 20 MEQ PO TBCR
40.0000 meq | EXTENDED_RELEASE_TABLET | Freq: Two times a day (BID) | ORAL | Status: DC
  Administered 2021-11-25: 40 meq via ORAL
  Filled 2021-11-25: qty 2

## 2021-11-25 MED ORDER — ONDANSETRON HCL 4 MG/2ML IJ SOLN
4.0000 mg | Freq: Once | INTRAMUSCULAR | Status: AC
  Administered 2021-11-25: 4 mg via INTRAVENOUS
  Filled 2021-11-25: qty 2

## 2021-11-25 MED ORDER — METOPROLOL SUCCINATE ER 25 MG PO TB24
25.0000 mg | ORAL_TABLET | Freq: Every day | ORAL | 0 refills | Status: DC
  Filled 2021-11-25: qty 30, 30d supply, fill #0

## 2021-11-25 MED ORDER — AMIODARONE HCL 200 MG PO TABS
200.0000 mg | ORAL_TABLET | Freq: Two times a day (BID) | ORAL | 0 refills | Status: DC
  Filled 2021-11-25: qty 60, 30d supply, fill #0

## 2021-11-25 MED ORDER — IRBESARTAN 300 MG PO TABS
300.0000 mg | ORAL_TABLET | Freq: Every day | ORAL | Status: DC
  Administered 2021-11-25: 300 mg via ORAL
  Filled 2021-11-25: qty 1

## 2021-11-25 MED ORDER — HYDRALAZINE HCL 20 MG/ML IJ SOLN
10.0000 mg | Freq: Four times a day (QID) | INTRAMUSCULAR | Status: DC | PRN
  Administered 2021-11-25: 10 mg via INTRAVENOUS
  Filled 2021-11-25: qty 1

## 2021-11-25 MED ORDER — ROSUVASTATIN CALCIUM 5 MG PO TABS
5.0000 mg | ORAL_TABLET | Freq: Every day | ORAL | Status: DC
  Administered 2021-11-25: 5 mg via ORAL
  Filled 2021-11-25: qty 1

## 2021-11-25 MED ORDER — ROSUVASTATIN CALCIUM 5 MG PO TABS
5.0000 mg | ORAL_TABLET | Freq: Every day | ORAL | 0 refills | Status: DC
  Filled 2021-11-25: qty 30, 30d supply, fill #0

## 2021-11-25 MED ORDER — APIXABAN 5 MG PO TABS
5.0000 mg | ORAL_TABLET | Freq: Two times a day (BID) | ORAL | 0 refills | Status: DC
  Filled 2021-11-25: qty 60, 30d supply, fill #0

## 2021-11-25 NOTE — Discharge Summary (Addendum)
Name: Kristie Gillespie MRN: [id number] DOB: May 14, 1926 88 y.o. PCP: Pcp, No  Date of Admission: 11/24/2021 10:14 AM Date of Discharge: 11/25/21 Attending Physician: Dr. Philipp Ovens  Discharge Diagnosis: Principal Problem:   A-fib [facility]) Active Problems:   New onset a-fib [facility])    Discharge Medications: Allergies as of 11/25/2021       Reactions   Diltiazem Nausea Only   Prednisone Other (See Comments)   Amoxicillin Rash   Tetracyclines & Related Rash        Medication List     STOP taking these medications    aspirin EC 81 MG tablet   fenoprofen 600 MG Tabs tablet Commonly known as: NALFON   metoprolol tartrate 50 MG tablet Commonly known as: LOPRESSOR   simvastatin 20 MG tablet Commonly known as: ZOCOR       TAKE these medications    acetaminophen 650 MG CR tablet Commonly known as: TYLENOL Take 650 mg by mouth at bedtime.   amiodarone 200 MG tablet Commonly known as: PACERONE Take 1 tablet (200 mg total) by mouth 2 (two) times daily.   CORICIDIN HBP PO Take 1 tablet by mouth daily. 10mg    CULTURELLE PO Take 1 capsule by mouth daily.   Eliquis 5 MG Tabs tablet Generic drug: apixaban Take 1 tablet (5 mg total) by mouth 2 (two) times daily.   felodipine 10 MG 24 hr tablet Commonly known as: PLENDIL Take 20 mg by mouth daily.   furosemide 20 MG tablet Commonly known as: LASIX Take 20 mg by mouth daily.   irbesartan 300 MG tablet Commonly known as: AVAPRO Take 300 mg by mouth daily.   Klor-Con M20 20 MEQ tablet Generic drug: potassium chloride SA Take 20 mEq by mouth daily.   metoprolol succinate 25 MG 24 hr tablet Commonly known as: TOPROL-XL Take 1 tablet (25 mg total) by mouth daily. Start taking on: November 26, 2021   multivitamin with minerals Tabs tablet Take 1 tablet by mouth daily.   rosuvastatin 5 MG tablet Commonly known as: CRESTOR Take 1 tablet (5 mg total) by mouth daily.   vitamin B-12 1000 MCG tablet Commonly  known as: CYANOCOBALAMIN Take 1,000 mcg by mouth daily.        Disposition and follow-up:   [name] was discharged from [facility] in Stable condition.  At the hospital follow up visit please address:  1.  Follow-up:  a. New onset atrial flutter with RVR - NSR on discharge. On amiodarone 200mg  BID, Apixaban 5mg  BID, Toprol XL 25mg  daily    b. HLD - Simvastatin stopped and crestor started instead due to possible interaction with amio    c. MI - previously on ASA, this has been discontinued in favor of Eliquis   d. HTN - she is being continued on Avapro and Pendil  2.  Labs / imaging needed at time of follow-up: BMP  3.  Pending labs/ test needing follow-up: none  4.  Medication Changes  Started: Amiodarone, eliquis, Crestor  Stopped: ASA,   Changed: metoprolol   Follow-up Appointments:  Follow-up Information      [facility]. Schedule an appointment as soon as possible for a visit in 1 week(s).   Specialty: Cardiology Contact information: [address] [id number] [place] [address] [phone]        Sueanne Margarita, MD. Schedule an appointment as soon as possible for a visit in 6 week(s).   Specialty: Cardiology  Contact information: [address] [place] [address] [facility] Course by problem list:  Patient presented with new onset afib. During workup in the ED, patient ws noted to have elevation in her troponins.  Cardiology was consulted. Elevation in troponin was felt to be related to demand ischemia in the setting of tachycardia. Repeat troponins trended down. Cardiology initiated the patient on amiodarone, elequis. She was able to convert to NSR. During morning rounds, she did report feeling nauseated and vomited a small amount but upon reassessment later in the afternoon symptoms had resolved and she felt confident going home.   She was discharged with new medications and instructions to follow up in afib clinic in 1 week and with cardiology in 6 weeks.   HTN - patient blood pressure medications had initially been held due to hypotension. After converting to NSR, she became hypertensive. She was given one dose of hydral and started on home avapro. She improved and remained hemodynamically stable. She was restarted on home meds upon discharge.   HLD - patient's simvistatin was discontinued in favor of Crestor due to deceased interaction with amiodarone.    Discharge Subjective: Patient was feeling nauseated and vomited small amount earlier in the morning. Reported feeling cold. Upon re-evaluation later in the day, she states she felt much improved and was ready to go home. Vomiting had resolved. Able to tolerate PO.   Discharge Exam:   BP (!) 136/58 (BP Location: Right Arm)    Pulse 85    Temp 98.2 F (36.8 C) (Oral)    Resp 20    Ht 5\' 2"  (1.575 m)    Wt 67.9 kg    SpO2 97%    BMI 27.38 kg/m  Constitutional: pleasant, well-appearing female sitting in bed, in no acute distress HENT: normocephalic atraumatic, mucous membranes moist Eyes: conjunctiva non-erythematous Neck: supple Cardiovascular: regular rate and rhythm, no m/r/g Pulmonary/Chest: normal work of breathing on room air, lungs clear to auscultation bilaterally Abdominal: soft, non-tender, non-distended MSK: normal bulk and tone Neurological: alert & oriented x 3, 5/5 strength in bilateral upper and lower extremities, normal gait Skin: warm and dry Psych: mood and affect appropriate   Pertinent Labs, Studies, and Procedures:  CBC Latest Ref Rng & Units 11/25/2021 11/24/2021  WBC 4.0 - 10.5 K/uL 7.6 7.9  Hemoglobin 12.0 - 15.0 g/dL 12.4 15.2(H)  Hematocrit 36.0 - 46.0 % 37.0 45.9  Platelets 150 - 400 K/uL 131(L) 166    CMP Latest Ref Rng & Units 11/25/2021 11/24/2021  Glucose 70 - 99 mg/dL 100(H) 103(H)  BUN 8 - 23 mg/dL 16 20  Creatinine 0.44 - 1.00  mg/dL 0.88 0.91  Sodium 135 - 145 mmol/L 140 141  Potassium 3.5 - 5.1 mmol/L 3.3(L) 3.8  Chloride 98 - 111 mmol/L 105 103  CO2 22 - 32 mmol/L 24 26  Calcium 8.9 - 10.3 mg/dL 8.6(L) 9.3  Total Protein 6.5 - 8.1 g/dL 5.4(L) -  Total Bilirubin 0.3 - 1.2 mg/dL 1.0 -  Alkaline Phos 38 - 126 U/L 60 -  AST 15 - 41 U/L 27 -  ALT 0 - 44 U/L 18 -    DG Chest Port 1 View  Result Date: 11/24/2021 CLINICAL DATA:  Atrial fibrillation EXAM: PORTABLE CHEST 1 VIEW COMPARISON:  09/11/2018 FINDINGS: The heart size and mediastinal contours are within normal limits. Aortic atherosclerosis. Chronically coarsened interstitial markings  bilaterally. No focal airspace consolidation, pleural effusion, or pneumothorax. The visualized skeletal structures are unremarkable. IMPRESSION: 1. No acute cardiopulmonary findings. 2. Chronic bronchitic type lung changes. Electronically Signed   By: Davina Poke D.O.   On: 11/24/2021 10:43   ECHOCARDIOGRAM COMPLETE  Result Date: 11/24/2021    ECHOCARDIOGRAM REPORT   Patient Name:   Kristie Gillespie Date of Exam: 11/24/2021 Medical Rec #:  [id number]         Height:       64.0 in Accession #:    [id number]        Weight:       145.0 lb Date of Birth:  07-24-1926          BSA:          1.706 m Patient Age:    68 years          BP:           125/111 mmHg Patient Gender: F                 HR:           131 bpm. Exam Location:  Inpatient Procedure: 2D Echo Indications:    Atrial fibrillation  History:        Patient has no prior history of Echocardiogram examinations.                 Previous Myocardial Infarction; Risk Factors:Dyslipidemia and                 Hypertension.  Sonographer:    Arlyss Gandy Referring Phys: [id number] Margie Billet  Sonographer Comments: Supine. IMPRESSIONS  1. Left ventricular ejection fraction, by estimation, is 60 to 65%. The left ventricle has normal function. The left ventricle has no regional wall motion abnormalities. Left ventricular diastolic  parameters are consistent with Grade II diastolic dysfunction (pseudonormalization).  2. Right ventricular systolic function is normal. The right ventricular size is normal. There is normal pulmonary artery systolic pressure. The estimated right ventricular systolic pressure is 09.3 mmHg.  3. The mitral valve is normal in structure. Mild mitral valve regurgitation. No evidence of mitral stenosis. Moderate mitral annular calcification.  4. Tricuspid valve regurgitation is mild to moderate.  5. The aortic valve is normal in structure. Aortic valve regurgitation is not visualized. No aortic stenosis is present.  6. The inferior vena cava is normal in size with greater than 50% respiratory variability, suggesting right atrial pressure of 3 mmHg. FINDINGS  Left Ventricle: Left ventricular ejection fraction, by estimation, is 60 to 65%. The left ventricle has normal function. The left ventricle has no regional wall motion abnormalities. The left ventricular internal cavity size was normal in size. There is  no left ventricular hypertrophy. Left ventricular diastolic parameters are consistent with Grade II diastolic dysfunction (pseudonormalization). Right Ventricle: The right ventricular size is normal. No increase in right ventricular wall thickness. Right ventricular systolic function is normal. There is normal pulmonary artery systolic pressure. The tricuspid regurgitant velocity is 2.23 m/s, and  with an assumed right atrial pressure of 3 mmHg, the estimated right ventricular systolic pressure is 23.5 mmHg. Left Atrium: Left atrial size was normal in size. Right Atrium: Right atrial size was normal in size. Pericardium: There is no evidence of pericardial effusion. Mitral Valve: The mitral valve is normal in structure. Moderate mitral annular calcification. Mild mitral valve regurgitation. No evidence of mitral valve stenosis. MV peak gradient, 8.2 mmHg. The mean mitral valve  gradient is 3.0 mmHg. Tricuspid Valve: The  tricuspid valve is normal in structure. Tricuspid valve regurgitation is mild to moderate. No evidence of tricuspid stenosis. Aortic Valve: The aortic valve is normal in structure. Aortic valve regurgitation is not visualized. No aortic stenosis is present. Aortic valve mean gradient measures 3.0 mmHg. Aortic valve peak gradient measures 6.1 mmHg. Aortic valve area, by VTI measures 2.74 cm. Pulmonic Valve: The pulmonic valve was normal in structure. Pulmonic valve regurgitation is not visualized. No evidence of pulmonic stenosis. Aorta: The aortic root is normal in size and structure. Venous: The inferior vena cava is normal in size with greater than 50% respiratory variability, suggesting right atrial pressure of 3 mmHg. IAS/Shunts: No atrial level shunt detected by color flow Doppler.  LEFT VENTRICLE PLAX 2D LVIDd:         3.44 cm   Diastology LVIDs:         2.38 cm   LV e' medial:  5.78 cm/s LV PW:         1.20 cm   LV e' lateral: 6.90 cm/s LV IVS:        1.26 cm LVOT diam:     1.90 cm LV SV:         78 LV SV Index:   46 LVOT Area:     2.84 cm  RIGHT VENTRICLE RV Basal diam:  2.68 cm RV Mid diam:    2.28 cm RV S prime:     11.90 cm/s TAPSE (M-mode): 1.3 cm LEFT ATRIUM             Index        RIGHT ATRIUM           Index LA diam:        3.50 cm 2.05 cm/m   RA Area:     15.20 cm LA Vol (A2C):   56.6 ml 33.17 ml/m  RA Volume:   34.20 ml  20.04 ml/m LA Vol (A4C):   48.8 ml 28.60 ml/m LA Biplane Vol: 52.8 ml 30.94 ml/m  AORTIC VALVE AV Area (Vmax):    2.51 cm AV Area (Vmean):   2.32 cm AV Area (VTI):     2.74 cm AV Vmax:           123.00 cm/s AV Vmean:          88.900 cm/s AV VTI:            0.284 m AV Peak Grad:      6.1 mmHg AV Mean Grad:      3.0 mmHg LVOT Vmax:         109.00 cm/s LVOT Vmean:        72.900 cm/s LVOT VTI:          0.274 m LVOT/AV VTI ratio: 0.96  AORTA Ao Root diam: 2.70 cm Ao Asc diam:  3.00 cm MITRAL VALVE              TRICUSPID VALVE MV Area (PHT): 2.29 cm   TR Peak grad:   19.9  mmHg MV Area VTI:   1.81 cm   TR Vmax:        223.00 cm/s MV Peak grad:  8.2 mmHg MV Mean grad:  3.0 mmHg   SHUNTS MV Vmax:       1.43 m/s   Systemic VTI:  0.27 m MV Vmean:      75.6 cm/s  Systemic Diam: 1.90 cm Candee Furbish MD Electronically signed by  Candee Furbish MD Signature Date/Time: 11/24/2021/3:47:43 PM    Final      Discharge Instructions: Discharge Instructions     Call MD for:  difficulty breathing, headache or visual disturbances   Complete by: As directed    Call MD for:  extreme fatigue   Complete by: As directed    Call MD for:  hives   Complete by: As directed    Call MD for:  persistant dizziness or light-headedness   Complete by: As directed    Call MD for:  persistant nausea and vomiting   Complete by: As directed    Call MD for:  redness, tenderness, or signs of infection (pain, swelling, redness, odor or green/yellow discharge around incision site)   Complete by: As directed    Call MD for:  severe uncontrolled pain   Complete by: As directed    Call MD for:  temperature >100.4   Complete by: As directed    Diet - low sodium heart healthy   Complete by: As directed    Increase activity slowly   Complete by: As directed      Dear Mrs. Duque,  Please continue to take Amiodarone 200mg  twice daily, Apixaban 5mg  twice daily, Toprol XL 25mg  daily, Avapro 300mg  daily, and Plendil 20mg  daily.  Follow up with afib clinic one week and cardiology 6 weeks.   Signed: Delene Ruffini, MD 11/25/2021, 6:37 PM   Pager: [phone]

## 2021-11-25 NOTE — Progress Notes (Signed)
Mobility Specialist Progress Note   11/25/21 1300  Mobility  Activity Ambulated with assistance in hallway  Level of Assistance Minimal assist, patient does 75% or more  Assistive Device Front wheel walker  Distance Ambulated (ft) 72 ft  Activity Response Tolerated well   Received pt having slight stomach pain upon arrival but agreeable to mobility. PTA pt did not use RW at home but requested it's use for this session. No fault during ambulation but fatigue inc'd w/ distance. Returned BTB w/ call bell by side and daughter in the room.    Pre Mobility: 91HR During Mobility: 127 HR Post Mobility: [place] Mobility Specialist Phone Number [phone]'

## 2021-11-25 NOTE — TOC Benefit Eligibility Note (Signed)
Patient Teacher, English as a foreign language completed.    The patient is currently admitted and upon discharge could be taking Eliquis 5 mg.  The current 30 day co-pay is, $85.02.   The patient is insured through [employer], [place] Patient Advocate Specialist [place] Patient Advocate Team Direct Number: [phone]  Fax: [phone]

## 2021-11-25 NOTE — Discharge Instructions (Signed)

## 2021-11-25 NOTE — Progress Notes (Addendum)
Progress Note  Patient Name: Kristie Gillespie Date of Encounter: 11/25/2021  [facility] Cardiologist: None NEW  Subjective   Denies any chest pain or SOB.  Admitted with afib with RVR and converted to NSR.  Continues to maintain NSR on tele.  Inpatient Medications    Scheduled Meds:  amiodarone  200 mg Oral BID   apixaban  5 mg Oral BID   metoprolol succinate  25 mg Oral Daily   multivitamin with minerals  1 tablet Oral Daily   Continuous Infusions:  PRN Meds: acetaminophen **OR** acetaminophen, hydrALAZINE, ondansetron **OR** ondansetron (ZOFRAN) IV   Vital Signs    Vitals:   11/24/21 1819 11/24/21 2329 11/25/21 0445 11/25/21 0800  BP: 129/73 (!) 166/86 (!) 186/98 (!) 191/80  Pulse: 85 68 73 77  Resp: 12 20 20 20   Temp: (!) 97.5 F (36.4 C) 98 F (36.7 C) 98.5 F (36.9 C) 98.3 F (36.8 C)  TempSrc: Oral Oral Oral Oral  SpO2: 97% 98% 96% 96%  Weight: 67.9 kg     Height: 5\' 2"  (1.575 m)       Intake/Output Summary (Last 24 hours) at 11/25/2021 0856 Last data filed at 11/25/2021 0446 Gross per 24 hour  Intake 249.14 ml  Output 700 ml  Net -450.86 ml   Last 3 Weights 11/24/2021 11/24/2021  Weight (lbs) 149 lb 11.1 oz 145 lb  Weight (kg) 67.9 kg 65.772 kg      Telemetry    NSR - Personally Reviewed  ECG    No new EKG to review - Personally Reviewed  Physical Exam   GEN: No acute distress.   Neck: No JVD Cardiac: RRR, no murmurs, rubs, or gallops.  Respiratory: Clear to auscultation bilaterally. GI: Soft, nontender, non-distended  MS: No edema; No deformity. Neuro:  Nonfocal  Psych: Normal affect   Labs    High Sensitivity Troponin:   Recent Labs  Lab 11/24/21 1038 11/24/21 1220  TROPONINIHS 500* 469*      Chemistry Recent Labs  Lab 11/24/21 1038 11/25/21 0151  NA 141 140  K 3.8 3.3*  CL 103 105  CO2 26 24  GLUCOSE 103* 100*  BUN 20 16  CREATININE 0.91 0.88  CALCIUM 9.3 8.6*  PROT  --  5.4*  ALBUMIN  --  3.3*  AST  --   27  ALT  --  18  ALKPHOS  --  60  BILITOT  --  1.0  GFRNONAA 59* >60  ANIONGAP 12 11     Hematology Recent Labs  Lab 11/24/21 1038 11/25/21 0151  WBC 7.9 7.6  RBC 4.43 3.64*  HGB 15.2* 12.4  HCT 45.9 37.0  MCV 103.6* 101.6*  MCH 34.3* 34.1*  MCHC 33.1 33.5  RDW 13.1 13.1  PLT 166 131*    BNPNo results for input(s): BNP, PROBNP in the last 168 hours.   DDimer No results for input(s): DDIMER in the last 168 hours.   CHA2DS2-VASc Score = 4  This indicates a 4.8% annual risk of stroke. The patient's score is based upon: CHF History: 0 HTN History: 1 Diabetes History: 0 Stroke History: 0 Vascular Disease History: 0 Age Score: 2 Gender Score: 1   Radiology    DG Chest Port 1 View  Result Date: 11/24/2021 CLINICAL DATA:  Atrial fibrillation EXAM: PORTABLE CHEST 1 VIEW COMPARISON:  09/11/2018 FINDINGS: The heart size and mediastinal contours are within normal limits. Aortic atherosclerosis. Chronically coarsened interstitial markings bilaterally. No focal airspace consolidation, pleural  effusion, or pneumothorax. The visualized skeletal structures are unremarkable. IMPRESSION: 1. No acute cardiopulmonary findings. 2. Chronic bronchitic type lung changes. Electronically Signed   By: Davina Poke D.O.   On: 11/24/2021 10:43   ECHOCARDIOGRAM COMPLETE  Result Date: 11/24/2021    ECHOCARDIOGRAM REPORT   Patient Name:   [facility] Date of Exam: 11/24/2021 Medical Rec #:  [id number]         Height:       64.0 in Accession #:    [id number]        Weight:       145.0 lb Date of Birth:  [birth year]          BSA:          1.706 m Patient Age:    64 years          BP:           125/111 mmHg Patient Gender: F                 HR:           131 bpm. Exam Location:  Inpatient Procedure: 2D Echo Indications:    Atrial fibrillation  History:        Patient has no prior history of Echocardiogram examinations.                 Previous Myocardial Infarction; Risk Factors:Dyslipidemia and                  Hypertension.  Sonographer:    Arlyss Gandy Referring Phys: [id number] Margie Billet  Sonographer Comments: Supine. IMPRESSIONS  1. Left ventricular ejection fraction, by estimation, is 60 to 65%. The left ventricle has normal function. The left ventricle has no regional wall motion abnormalities. Left ventricular diastolic parameters are consistent with Grade II diastolic dysfunction (pseudonormalization).  2. Right ventricular systolic function is normal. The right ventricular size is normal. There is normal pulmonary artery systolic pressure. The estimated right ventricular systolic pressure is 61.9 mmHg.  3. The mitral valve is normal in structure. Mild mitral valve regurgitation. No evidence of mitral stenosis. Moderate mitral annular calcification.  4. Tricuspid valve regurgitation is mild to moderate.  5. The aortic valve is normal in structure. Aortic valve regurgitation is not visualized. No aortic stenosis is present.  6. The inferior vena cava is normal in size with greater than 50% respiratory variability, suggesting right atrial pressure of 3 mmHg. FINDINGS  Left Ventricle: Left ventricular ejection fraction, by estimation, is 60 to 65%. The left ventricle has normal function. The left ventricle has no regional wall motion abnormalities. The left ventricular internal cavity size was normal in size. There is  no left ventricular hypertrophy. Left ventricular diastolic parameters are consistent with Grade II diastolic dysfunction (pseudonormalization). Right Ventricle: The right ventricular size is normal. No increase in right ventricular wall thickness. Right ventricular systolic function is normal. There is normal pulmonary artery systolic pressure. The tricuspid regurgitant velocity is 2.23 m/s, and  with an assumed right atrial pressure of 3 mmHg, the estimated right ventricular systolic pressure is 50.9 mmHg. Left Atrium: Left atrial size was normal in size. Right Atrium: Right atrial  size was normal in size. Pericardium: There is no evidence of pericardial effusion. Mitral Valve: The mitral valve is normal in structure. Moderate mitral annular calcification. Mild mitral valve regurgitation. No evidence of mitral valve stenosis. MV peak gradient, 8.2 mmHg. The mean mitral valve gradient is 3.0 mmHg. Tricuspid  Valve: The tricuspid valve is normal in structure. Tricuspid valve regurgitation is mild to moderate. No evidence of tricuspid stenosis. Aortic Valve: The aortic valve is normal in structure. Aortic valve regurgitation is not visualized. No aortic stenosis is present. Aortic valve mean gradient measures 3.0 mmHg. Aortic valve peak gradient measures 6.1 mmHg. Aortic valve area, by VTI measures 2.74 cm. Pulmonic Valve: The pulmonic valve was normal in structure. Pulmonic valve regurgitation is not visualized. No evidence of pulmonic stenosis. Aorta: The aortic root is normal in size and structure. Venous: The inferior vena cava is normal in size with greater than 50% respiratory variability, suggesting right atrial pressure of 3 mmHg. IAS/Shunts: No atrial level shunt detected by color flow Doppler.  LEFT VENTRICLE PLAX 2D LVIDd:         3.44 cm   Diastology LVIDs:         2.38 cm   LV e' medial:  5.78 cm/s LV PW:         1.20 cm   LV e' lateral: 6.90 cm/s LV IVS:        1.26 cm LVOT diam:     1.90 cm LV SV:         78 LV SV Index:   46 LVOT Area:     2.84 cm  RIGHT VENTRICLE RV Basal diam:  2.68 cm RV Mid diam:    2.28 cm RV S prime:     11.90 cm/s TAPSE (M-mode): 1.3 cm LEFT ATRIUM             Index        RIGHT ATRIUM           Index LA diam:        3.50 cm 2.05 cm/m   RA Area:     15.20 cm LA Vol (A2C):   56.6 ml 33.17 ml/m  RA Volume:   34.20 ml  20.04 ml/m LA Vol (A4C):   48.8 ml 28.60 ml/m LA Biplane Vol: 52.8 ml 30.94 ml/m  AORTIC VALVE AV Area (Vmax):    2.51 cm AV Area (Vmean):   2.32 cm AV Area (VTI):     2.74 cm AV Vmax:           123.00 cm/s AV Vmean:          88.900  cm/s AV VTI:            0.284 m AV Peak Grad:      6.1 mmHg AV Mean Grad:      3.0 mmHg LVOT Vmax:         109.00 cm/s LVOT Vmean:        72.900 cm/s LVOT VTI:          0.274 m LVOT/AV VTI ratio: 0.96  AORTA Ao Root diam: 2.70 cm Ao Asc diam:  3.00 cm MITRAL VALVE              TRICUSPID VALVE MV Area (PHT): 2.29 cm   TR Peak grad:   19.9 mmHg MV Area VTI:   1.81 cm   TR Vmax:        223.00 cm/s MV Peak grad:  8.2 mmHg MV Mean grad:  3.0 mmHg   SHUNTS MV Vmax:       1.43 m/s   Systemic VTI:  0.27 m MV Vmean:      75.6 cm/s  Systemic Diam: 1.90 cm Candee Furbish MD Electronically signed by Candee Furbish MD Signature Date/Time: 11/24/2021/3:47:43  PM    Final     Cardiac Studies   2D echo 11/2021 IMPRESSIONS   1. Left ventricular ejection fraction, by estimation, is 60 to 65%. The  left ventricle has normal function. The left ventricle has no regional  wall motion abnormalities. Left ventricular diastolic parameters are  consistent with Grade II diastolic  dysfunction (pseudonormalization).   2. Right ventricular systolic function is normal. The right ventricular  size is normal. There is normal pulmonary artery systolic pressure. The  estimated right ventricular systolic pressure is 10.2 mmHg.   3. The mitral valve is normal in structure. Mild mitral valve  regurgitation. No evidence of mitral stenosis. Moderate mitral annular  calcification.   4. Tricuspid valve regurgitation is mild to moderate.   5. The aortic valve is normal in structure. Aortic valve regurgitation is  not visualized. No aortic stenosis is present.   6. The inferior vena cava is normal in size with greater than 50%  respiratory variability, suggesting right atrial pressure of 3 mmHg.   Patient Profile     88 y.o. female with a hx of HTN, CKD stage IIIa, skin cancer, HLD, CAD with a history of MI in 2015 (no stents or CABG) who is being seen 11/24/2021 for the evaluation of Afib with RVR at the request of Dr.  Gilford Raid.  Assessment & Plan    Atrial Fibrillation with RVR:  - New onset unknown duration>>patient completely asymptomatic - CHADS-VASc 30 (age x2, gender, HTN)  - hsTN 500>>469, likely demand ischemia in the setting of tachycardia. She denied any CP - Was initially started on IV diltiazem, but the patient has low BP so it was discontinued.  - Started IV amiodarone bolus and infusion and converted to NSR - continues to maintain NSR on exam and tele today - 2D echo with normal LVF - TSH and ALT normal - continue Amio 200mg  BID, Toprol XL 25mg  daily and Eliquis 5mg  BID - keep K>4 and Mag >2  HTN  - Patient reports taking metoprolol, irbesartan for BP management  - BP meds were held for hypotension in setting of afib with RVR yesterday - restart Irbesartan 300mg  daily and if BP remains elevated restart Felodipine  - she was also on Lasix at home and can be restarted as BP tolerates   HLD  - Continue simvastatin    CAD  - MI in 2015, managed medically  - she denies any chest pain - hs Trop elevated 500>469 and likely related to demand ischemia in the setting of afib with RVR and hypotension - 2D echo with normal LVF - no further ischemic workup given normal LVF, advanced age and no sx of CP - Continue ASA, statin and BB  Hypokalemia - K+ 3.2 - replete K+ today to keep >4 - check Mag level   [facility] will sign off.   Medication Recommendations:  Amiodarone 200mg  BID, Apixaban 5mg  BID, Toprol XL 25mg  daily, Avapro 300mg  daily>>other BP meds to be determined at discharge by [facility] Other recommendations (labs, testing, etc):  none Follow up as an outpatient:  afib clinic in 1 week and Dr. Radford Pax or extender in 6 weeks  For questions or updates, please contact [place] Please consult [email or web address] for contact info under        Signed, Fransico Him, MD  11/25/2021, 8:56 AM

## 2021-11-26 ENCOUNTER — Telehealth (HOSPITAL_COMMUNITY): Payer: Self-pay

## 2021-11-26 ENCOUNTER — Telehealth: Payer: Self-pay | Admitting: Internal Medicine

## 2021-11-26 ENCOUNTER — Observation Stay (HOSPITAL_COMMUNITY)
Admission: EM | Admit: 2021-11-26 | Discharge: 2021-11-29 | Disposition: A | Discharge status: Home / Self Care | Payer: Federal, State, Local not specified - PPO | Attending: Internal Medicine | Admitting: Internal Medicine

## 2021-11-26 ENCOUNTER — Telehealth: Payer: Self-pay | Admitting: Cardiology

## 2021-11-26 ENCOUNTER — Other Ambulatory Visit (HOSPITAL_COMMUNITY): Payer: Self-pay

## 2021-11-26 ENCOUNTER — Other Ambulatory Visit: Payer: Self-pay

## 2021-11-26 DIAGNOSIS — R1110 Vomiting, unspecified: Secondary | ICD-10-CM

## 2021-11-26 DIAGNOSIS — Z7901 Long term (current) use of anticoagulants: Secondary | ICD-10-CM | POA: Insufficient documentation

## 2021-11-26 DIAGNOSIS — I5033 Acute on chronic diastolic (congestive) heart failure: Secondary | ICD-10-CM | POA: Diagnosis not present

## 2021-11-26 DIAGNOSIS — R079 Chest pain, unspecified: Secondary | ICD-10-CM | POA: Diagnosis not present

## 2021-11-26 DIAGNOSIS — R519 Headache, unspecified: Secondary | ICD-10-CM

## 2021-11-26 DIAGNOSIS — I480 Paroxysmal atrial fibrillation: Secondary | ICD-10-CM | POA: Insufficient documentation

## 2021-11-26 DIAGNOSIS — R030 Elevated blood-pressure reading, without diagnosis of hypertension: Secondary | ICD-10-CM

## 2021-11-26 DIAGNOSIS — Z79899 Other long term (current) drug therapy: Secondary | ICD-10-CM | POA: Diagnosis not present

## 2021-11-26 DIAGNOSIS — I160 Hypertensive urgency: Principal | ICD-10-CM | POA: Diagnosis present

## 2021-11-26 DIAGNOSIS — R110 Nausea: Secondary | ICD-10-CM

## 2021-11-26 DIAGNOSIS — Z20822 Contact with and (suspected) exposure to covid-19: Secondary | ICD-10-CM | POA: Insufficient documentation

## 2021-11-26 DIAGNOSIS — I110 Hypertensive heart disease with heart failure: Secondary | ICD-10-CM | POA: Diagnosis not present

## 2021-11-26 DIAGNOSIS — Z7409 Other reduced mobility: Secondary | ICD-10-CM | POA: Diagnosis not present

## 2021-11-26 DIAGNOSIS — R109 Unspecified abdominal pain: Secondary | ICD-10-CM

## 2021-11-26 DIAGNOSIS — Z85828 Personal history of other malignant neoplasm of skin: Secondary | ICD-10-CM | POA: Insufficient documentation

## 2021-11-26 DIAGNOSIS — K5900 Constipation, unspecified: Secondary | ICD-10-CM

## 2021-11-26 MED ORDER — ONDANSETRON HCL 4 MG/2ML IJ SOLN
4.0000 mg | Freq: Once | INTRAMUSCULAR | Status: AC
  Administered 2021-11-26: 4 mg via INTRAVENOUS
  Filled 2021-11-26: qty 2

## 2021-11-26 MED ORDER — LABETALOL HCL 5 MG/ML IV SOLN
5.0000 mg | Freq: Once | INTRAVENOUS | Status: AC
  Administered 2021-11-26: 5 mg via INTRAVENOUS
  Filled 2021-11-26: qty 4

## 2021-11-26 MED ORDER — LABETALOL HCL 5 MG/ML IV SOLN
5.0000 mg | Freq: Once | INTRAVENOUS | Status: AC
  Administered 2021-11-27: 5 mg via INTRAVENOUS
  Filled 2021-11-26: qty 4

## 2021-11-26 NOTE — Telephone Encounter (Signed)
Pharmacy Transitions of Care Follow-up Telephone Call  Date of discharge: 11/25/21  Discharge Diagnosis: Afib  How have you been since you were released from the hospital? Pt is doing fine.    Medication changes made at discharge:  - START: Eliquis   - STOPPED:   - CHANGED:   Medication changes verified by the patient?  Yes  Medication Accessibility:  Home Pharmacy:    Was the patient provided with refills on discharged medications? no   Have all prescriptions been transferred from [facility] to home pharmacy? na   Is the patient able to afford medications?  Notable copays:  Eligible patient assistance:     Medication Review: (APIXABAN (ELIQUIS)  Apixaban 10 mg BID initiated on . Will switch to apixaban 5 mg BID after 7 days (DATE).  - Discussed importance of taking medication around the same time everyday  - Reviewed potential DDIs with patient  - Advised patient of medications to avoid (NSAIDs, ASA)  - Educated that Tylenol (acetaminophen) will be the preferred analgesic to prevent risk of bleeding  - Emphasized importance of monitoring for signs and symptoms of bleeding (abnormal bruising, prolonged bleeding, nose bleeds, bleeding from gums, discolored urine, black tarry stools)  - Advised patient to alert all providers of anticoagulation therapy prior to starting a new medication or having a procedure  Follow-up Appointments:  PCP Hospital f/u appt confirmed? yes   If their condition worsens, is the pt aware to call PCP or go to the Emergency Dept.? yes  Final Patient Assessment: Pt was sleeping. I spoke with her daughter who is her caregiver. She seems pleased with how her mother is doing since discharge yesterday. BP was good after taking meds. We went over BP range and Eliquis. Daughter is aware of follow up appts.

## 2021-11-26 NOTE — ED Provider Notes (Signed)
[facility] EMERGENCY DEPARTMENT Provider Note   CSN: [id number] Arrival date & time: 11/26/21  2046     History  Chief Complaint  Patient presents with   Nausea    Kristie Gillespie is a 88 y.o. female.  The history is provided by the patient, a relative and medical records. No language interpreter was used.  Headache Pain location:  Generalized Quality:  Dull Radiates to:  Does not radiate Severity currently:  10/10 Severity at highest:  10/10 Onset quality:  Gradual Duration:  1 day Timing:  Constant Progression:  Unchanged Similar to prior headaches: no   Context: not exposure to bright light   Relieved by:  Nothing Worsened by:  Nothing Ineffective treatments:  None tried Associated symptoms: abdominal pain, back pain and nausea   Associated symptoms: no congestion, no cough, no diarrhea, no dizziness, no fatigue, no fever, no focal weakness, no loss of balance, no neck pain, no numbness, no paresthesias, no photophobia, no seizures, no visual change and no vomiting       Home Medications Prior to Admission medications   Medication Sig Start Date End Date Taking? Authorizing Provider  acetaminophen (TYLENOL) 650 MG CR tablet Take 650 mg by mouth at bedtime.    [provider]  amiodarone (PACERONE) 200 MG tablet Take 1 tablet (200 mg total) by mouth 2 (two) times daily. 11/25/21 12/25/21  Delene Ruffini, MD  apixaban (ELIQUIS) 5 MG TABS tablet Take 1 tablet (5 mg total) by mouth 2 (two) times daily. 11/25/21 12/25/21  Delene Ruffini, MD  DM-APAP-CPM (CORICIDIN HBP PO) Take 1 tablet by mouth daily. 10mg     [provider]  felodipine (PLENDIL) 10 MG 24 hr tablet Take 20 mg by mouth daily. 11/16/21   [provider]  furosemide (LASIX) 20 MG tablet Take 20 mg by mouth daily. 09/20/21   [provider]  irbesartan (AVAPRO) 300 MG tablet Take 300 mg by mouth daily. 09/17/21   [provider]  KLOR-CON M20  20 MEQ tablet Take 20 mEq by mouth daily. 11/21/21   [provider]  Lactobacillus Rhamnosus, GG, (CULTURELLE PO) Take 1 capsule by mouth daily.    [provider]  metoprolol succinate (TOPROL-XL) 25 MG 24 hr tablet Take 1 tablet (25 mg total) by mouth daily. 11/26/21 12/26/21  Delene Ruffini, MD  Multiple Vitamin (MULTIVITAMIN WITH MINERALS) TABS tablet Take 1 tablet by mouth daily.    [provider]  rosuvastatin (CRESTOR) 5 MG tablet Take 1 tablet (5 mg total) by mouth daily. 11/25/21 12/25/21  Delene Ruffini, MD  vitamin B-12 (CYANOCOBALAMIN) 1000 MCG tablet Take 1,000 mcg by mouth daily.    [provider]      Allergies    Diltiazem, Prednisone, Amoxicillin, and Tetracyclines & related    Review of Systems   Review of Systems  Constitutional:  Negative for chills, diaphoresis, fatigue and fever.  HENT:  Negative for congestion.   Eyes:  Negative for photophobia.  Respiratory:  Negative for cough, chest tightness, shortness of breath and wheezing.   Cardiovascular:  Positive for chest pain. Negative for palpitations and leg swelling.  Gastrointestinal:  Positive for abdominal pain and nausea. Negative for constipation, diarrhea and vomiting.  Genitourinary:  Negative for dysuria, flank pain and frequency.  Musculoskeletal:  Positive for back pain. Negative for neck pain.  Skin:  Negative for rash and wound.  Neurological:  Positive for headaches. Negative for dizziness, focal weakness, seizures, light-headedness, numbness, paresthesias  and loss of balance.  Psychiatric/Behavioral:  Negative for agitation.   All other systems reviewed and are negative.  Physical Exam Updated Vital Signs SpO2 95%  Physical Exam Vitals and nursing note reviewed.  Constitutional:      General: She is not in acute distress.    Appearance: She is well-developed. She is not ill-appearing, toxic-appearing or diaphoretic.  HENT:     Head: Normocephalic and  atraumatic.     Mouth/Throat:     Mouth: Mucous membranes are moist.  Eyes:     Extraocular Movements: Extraocular movements intact.     Conjunctiva/sclera: Conjunctivae normal.     Pupils: Pupils are equal, round, and reactive to light.  Cardiovascular:     Rate and Rhythm: Normal rate and regular rhythm.     Pulses: Normal pulses.     Heart sounds: No murmur heard. Pulmonary:     Effort: Pulmonary effort is normal. No respiratory distress.     Breath sounds: Normal breath sounds. No wheezing, rhonchi or rales.  Chest:     Chest wall: No tenderness.  Abdominal:     General: Abdomen is flat.     Palpations: Abdomen is soft.     Tenderness: There is abdominal tenderness. There is no right CVA tenderness, left CVA tenderness, guarding or rebound.  Musculoskeletal:        General: No swelling.     Cervical back: Neck supple. No tenderness.  Skin:    General: Skin is warm and dry.     Capillary Refill: Capillary refill takes less than 2 seconds.     Findings: No erythema or rash.  Neurological:     General: No focal deficit present.     Mental Status: She is alert.     Sensory: No sensory deficit.     Motor: No weakness.  Psychiatric:        Mood and Affect: Mood normal.    ED Results / Procedures / Treatments   Labs (all labs ordered are listed, but only abnormal results are displayed) Labs Reviewed  RESP PANEL BY RT-PCR (FLU A&B, COVID) ARPGX2  URINE CULTURE  CBC WITH DIFFERENTIAL/PLATELET  COMPREHENSIVE METABOLIC PANEL  LIPASE, BLOOD  LACTIC ACID, PLASMA  LACTIC ACID, PLASMA  TSH  URINALYSIS, ROUTINE W REFLEX MICROSCOPIC  TROPONIN I (HIGH SENSITIVITY)  TROPONIN I (HIGH SENSITIVITY)    EKG EKG Interpretation  Date/Time:  Friday November 26 2021 21:24:27 EST Ventricular Rate:  74 PR Interval:  157 QRS Duration: 96 QT Interval:  397 QTC Calculation: 441 R Axis:   56 Text Interpretation: Sinus rhythm Supraventricular bigeminy when compared to prior, similar  appearance. No sTEMI Confirmed by Antony Blackbird [id number]) on 11/26/2021 9:44:51 PM  Radiology No results found.  Procedures Procedures    Medications Ordered in ED Medications  ondansetron (ZOFRAN) injection 4 mg (4 mg Intravenous Given 11/26/21 2336)  labetalol (NORMODYNE) injection 5 mg (5 mg Intravenous Given 11/26/21 2323)    ED Course/ Medical Decision Making/ A&P                           Medical Decision Making Amount and/or Complexity of Data Reviewed Labs: ordered. Radiology: ordered.  Risk Prescription drug management.    Antwonette Feliz is a 88 y.o. female with a past medical history significant for hypertension, hyperlipidemia, previous MI without PCI, and new diagnosis of A-fib on Eliquis discharged yesterday who presents with new severe headache, nausea, chest pain,  abdominal pain, and severe elevated blood pressures.  According to patient, since discharge yesterday, blood pressures have been worsening and rising.  Blood pressure was over 200 today and she started having pain in her abdomen going into her chest and to her back.  She also reports she is having moderate to severe headache with nausea.  She denies any vision changes and denies any neck pain.  Denies any constipation, diarrhea, or dysuria at this time.  She does say her urine has been slightly more pink in coloration denies any congestion or cough.  Denies any trauma.  Reports she is not had this pain in the past and this is all new today.  She reports that she tried to call her outpatient team but did not have success and so she came in for evaluation.  On exam, blood pressure of over 200.  Lungs were clear and chest was nontender.  No murmur.  Abdomen was tender in the upper abdomen.  Normal bowel sounds.  Flanks and back nontender.  No carotid bruit appreciated.  No acute focal neurologic deficits with normal sensation and strength in extremities.  Symmetric smile.  Clear speech.  Clinically I am  concerned about several etiologies of her symptoms.  With the new blood thinner use, new headache, and nausea with elevated blood pressures over 200, will get CT head to rule out acute intracranial hemorrhage.  With the new abdominal pain and chest pain going to her back with blood pressure over 200, will get dissection study to rule out dissection.  We will get urinalysis to look for UTI with the discoloration urine and with the chest discomfort and history of MI we will also get cardiac work-up with troponins.  With her headaches and blood pressure being high, hypertensive emergency or even press is considered.  She is denying neurologic deficits at this time.    We will give some labetalol to help with her blood pressure initially and I do anticipate she will need admission.   Care transferred to oncoming team to await work-up to be completed prior to likely admission.         Final Clinical Impression(s) / ED Diagnoses Final diagnoses:  Nausea  Acute nonintractable headache, unspecified headache type  Chest pain, unspecified type  Abdominal pain, unspecified abdominal location  Elevated blood pressure reading     Clinical Impression: 1. Nausea   2. Acute nonintractable headache, unspecified headache type   3. Chest pain, unspecified type   4. Abdominal pain, unspecified abdominal location   5. Elevated blood pressure reading     Disposition: Admit  This note was prepared with assistance of Dragon voice recognition software. Occasional wrong-word or sound-a-like substitutions may have occurred due to the inherent limitations of voice recognition software.      Alyscia Carmon, Gwenyth Allegra, MD 11/26/21 [id number]

## 2021-11-26 NOTE — Telephone Encounter (Signed)
Daughter was calling in to see how she can get her moms bp down. She was seen in the hospital by dr turner. Please advise

## 2021-11-26 NOTE — ED Triage Notes (Signed)
Pt BIB [facility] after family called for persistent nausea since being discharged yesterday and starting new meds. Patient family reports d/c paperwork indicated to return for persistent nausea, ergo patient returned. Patient hypertensive at 200/110, asymptomatic at this time. Pt has allergy to cardizem and was given cardizem yesterday PTA Wednesday. Pt A&O to baseline, HOH, NAD.

## 2021-11-26 NOTE — Telephone Encounter (Signed)
Patient had been discharged on Felodipine. Per cardiology note, it was recommended that this medication be held. I called and spoke with daughter, Kristie Gillespie, via telephone and discussed holding her Felodipine until she can be seen by cardiology. Daughter voiced understanding.

## 2021-11-26 NOTE — Telephone Encounter (Signed)
Spoke with the patient's daughter who states that they got home from the hospital last night and the patient was doing good. She took the patient's BP this morning and it was somewhat elevated prior to medications. She gave the patient her AM meds (irbesartan, metoprolol, amiodarone, and eliquis) and a couple of hours later her blood pressure was good. She did not have the numbers written down to give me. She states that she took the patient's blood pressure again this afternoon and it was 185/111. The patient is feeling good and is asymptomatic. She is going to have her mom rest and recheck her blood pressure shortly and will call back if it remains elevated.

## 2021-11-27 ENCOUNTER — Emergency Department (HOSPITAL_COMMUNITY): Payer: Federal, State, Local not specified - PPO

## 2021-11-27 ENCOUNTER — Observation Stay (HOSPITAL_COMMUNITY): Payer: Federal, State, Local not specified - PPO

## 2021-11-27 ENCOUNTER — Encounter (HOSPITAL_COMMUNITY): Payer: Self-pay | Admitting: Internal Medicine

## 2021-11-27 DIAGNOSIS — R109 Unspecified abdominal pain: Secondary | ICD-10-CM | POA: Diagnosis not present

## 2021-11-27 DIAGNOSIS — R110 Nausea: Secondary | ICD-10-CM

## 2021-11-27 DIAGNOSIS — I21A1 Myocardial infarction type 2: Secondary | ICD-10-CM | POA: Diagnosis not present

## 2021-11-27 DIAGNOSIS — I110 Hypertensive heart disease with heart failure: Secondary | ICD-10-CM | POA: Diagnosis not present

## 2021-11-27 DIAGNOSIS — I160 Hypertensive urgency: Secondary | ICD-10-CM | POA: Diagnosis present

## 2021-11-27 LAB — CBC WITH DIFFERENTIAL/PLATELET
Abs Immature Granulocytes: 0.06 10*3/uL (ref 0.00–0.07)
Basophils Absolute: 0 10*3/uL (ref 0.0–0.1)
Basophils Relative: 0 %
Eosinophils Absolute: 0 10*3/uL (ref 0.0–0.5)
Eosinophils Relative: 0 %
HCT: 39.3 % (ref 36.0–46.0)
Hemoglobin: 12.9 g/dL (ref 12.0–15.0)
Immature Granulocytes: 1 %
Lymphocytes Relative: 9 %
Lymphs Abs: 0.8 10*3/uL (ref 0.7–4.0)
MCH: 34.3 pg — ABNORMAL HIGH (ref 26.0–34.0)
MCHC: 32.8 g/dL (ref 30.0–36.0)
MCV: 104.5 fL — ABNORMAL HIGH (ref 80.0–100.0)
Monocytes Absolute: 0.9 10*3/uL (ref 0.1–1.0)
Monocytes Relative: 10 %
Neutro Abs: 7.5 10*3/uL (ref 1.7–7.7)
Neutrophils Relative %: 80 %
Platelets: 146 10*3/uL — ABNORMAL LOW (ref 150–400)
RBC: 3.76 MIL/uL — ABNORMAL LOW (ref 3.87–5.11)
RDW: 13.2 % (ref 11.5–15.5)
WBC: 9.3 10*3/uL (ref 4.0–10.5)
nRBC: 0 % (ref 0.0–0.2)

## 2021-11-27 LAB — TSH: TSH: 2.611 u[IU]/mL (ref 0.350–4.500)

## 2021-11-27 LAB — COMPREHENSIVE METABOLIC PANEL
ALT: 21 U/L (ref 0–44)
ALT: 22 U/L (ref 0–44)
AST: 40 U/L (ref 15–41)
AST: 43 U/L — ABNORMAL HIGH (ref 15–41)
Albumin: 3.4 g/dL — ABNORMAL LOW (ref 3.5–5.0)
Albumin: 3.4 g/dL — ABNORMAL LOW (ref 3.5–5.0)
Alkaline Phosphatase: 60 U/L (ref 38–126)
Alkaline Phosphatase: 64 U/L (ref 38–126)
Anion gap: 11 (ref 5–15)
Anion gap: 13 (ref 5–15)
BUN: 13 mg/dL (ref 8–23)
BUN: 14 mg/dL (ref 8–23)
CO2: 23 mmol/L (ref 22–32)
CO2: 24 mmol/L (ref 22–32)
Calcium: 9 mg/dL (ref 8.9–10.3)
Calcium: 9 mg/dL (ref 8.9–10.3)
Chloride: 101 mmol/L (ref 98–111)
Chloride: 101 mmol/L (ref 98–111)
Creatinine, Ser: 0.75 mg/dL (ref 0.44–1.00)
Creatinine, Ser: 0.75 mg/dL (ref 0.44–1.00)
GFR, Estimated: >60 mL/min (ref >60)
GFR, Estimated: >60 mL/min (ref >60)
Glucose, Bld: 123 mg/dL — ABNORMAL HIGH (ref 70–99)
Glucose, Bld: 133 mg/dL — ABNORMAL HIGH (ref 70–99)
Potassium: 4.8 mmol/L (ref 3.5–5.1)
Potassium: 5.2 mmol/L — ABNORMAL HIGH (ref 3.5–5.1)
Sodium: 136 mmol/L (ref 135–145)
Sodium: 137 mmol/L (ref 135–145)
Total Bilirubin: 2.9 mg/dL — ABNORMAL HIGH (ref 0.3–1.2)
Total Bilirubin: 3.4 mg/dL — ABNORMAL HIGH (ref 0.3–1.2)
Total Protein: 6.1 g/dL — ABNORMAL LOW (ref 6.5–8.1)
Total Protein: 6.3 g/dL — ABNORMAL LOW (ref 6.5–8.1)

## 2021-11-27 LAB — URINALYSIS, ROUTINE W REFLEX MICROSCOPIC
Bacteria, UA: NONE SEEN
Bilirubin Urine: NEGATIVE
Glucose, UA: NEGATIVE mg/dL
Ketones, ur: 80 mg/dL — AB
Nitrite: NEGATIVE
Protein, ur: 30 mg/dL — AB
Specific Gravity, Urine: 1.039 — ABNORMAL HIGH (ref 1.005–1.030)
pH: 5 (ref 5.0–8.0)

## 2021-11-27 LAB — RESP PANEL BY RT-PCR (FLU A&B, COVID) ARPGX2
Influenza A by PCR: NEGATIVE
Influenza B by PCR: NEGATIVE
SARS Coronavirus 2 by RT PCR: NEGATIVE

## 2021-11-27 LAB — TROPONIN I (HIGH SENSITIVITY)
Troponin I (High Sensitivity): 227 ng/L (ref <18)
Troponin I (High Sensitivity): 228 ng/L (ref <18)
Troponin I (High Sensitivity): 246 ng/L (ref <18)

## 2021-11-27 LAB — PROTIME-INR
INR: 1.5 — ABNORMAL HIGH (ref 0.8–1.2)
Prothrombin Time: 18.4 seconds — ABNORMAL HIGH (ref 11.4–15.2)

## 2021-11-27 LAB — LACTIC ACID, PLASMA: Lactic Acid, Venous: 1.4 mmol/L (ref 0.5–1.9)

## 2021-11-27 LAB — MAGNESIUM: Magnesium: 2 mg/dL (ref 1.7–2.4)

## 2021-11-27 LAB — LIPASE, BLOOD: Lipase: 26 U/L (ref 11–51)

## 2021-11-27 LAB — BRAIN NATRIURETIC PEPTIDE: B Natriuretic Peptide: 494.1 pg/mL — ABNORMAL HIGH (ref 0.0–100.0)

## 2021-11-27 LAB — PHOSPHORUS: Phosphorus: 2.6 mg/dL (ref 2.5–4.6)

## 2021-11-27 MED ORDER — ENOXAPARIN SODIUM 40 MG/0.4ML IJ SOSY: 40.0000 mg | PREFILLED_SYRINGE | INTRAMUSCULAR | Status: DC

## 2021-11-27 MED ORDER — APIXABAN 5 MG PO TABS
5.0000 mg | ORAL_TABLET | Freq: Two times a day (BID) | ORAL | Status: DC
  Administered 2021-11-27 – 2021-11-29 (×5): 5 mg via ORAL
  Filled 2021-11-27 (×5): qty 1

## 2021-11-27 MED ORDER — BISACODYL 10 MG RE SUPP
10.0000 mg | Freq: Once | RECTAL | Status: AC
  Administered 2021-11-27: 10 mg via RECTAL
  Filled 2021-11-27: qty 1

## 2021-11-27 MED ORDER — POLYETHYLENE GLYCOL 3350 17 G PO PACK: 17.0000 g | PACK | Freq: Every day | ORAL | Status: DC | PRN

## 2021-11-27 MED ORDER — ACETAMINOPHEN 650 MG RE SUPP: 650.0000 mg | Freq: Four times a day (QID) | RECTAL | Status: DC | PRN

## 2021-11-27 MED ORDER — ACETAMINOPHEN 325 MG PO TABS
650.0000 mg | ORAL_TABLET | Freq: Four times a day (QID) | ORAL | Status: DC | PRN
  Administered 2021-11-27 – 2021-11-28 (×3): 650 mg via ORAL
  Filled 2021-11-27 (×3): qty 2

## 2021-11-27 MED ORDER — IOHEXOL 350 MG/ML SOLN
100.0000 mL | Freq: Once | INTRAVENOUS | Status: AC | PRN
  Administered 2021-11-27: 100 mL via INTRAVENOUS

## 2021-11-27 MED ORDER — SODIUM CHLORIDE 0.9% FLUSH
3.0000 mL | Freq: Two times a day (BID) | INTRAVENOUS | Status: DC
  Administered 2021-11-27 – 2021-11-29 (×6): 3 mL via INTRAVENOUS

## 2021-11-27 MED ORDER — LABETALOL HCL 5 MG/ML IV SOLN: 5.0000 mg | INTRAVENOUS | Status: DC | PRN

## 2021-11-27 MED ORDER — IRBESARTAN 150 MG PO TABS
300.0000 mg | ORAL_TABLET | Freq: Every day | ORAL | Status: DC
  Administered 2021-11-27 – 2021-11-29 (×3): 300 mg via ORAL
  Filled 2021-11-27 (×3): qty 2
  Filled 2021-11-27: qty 1

## 2021-11-27 MED ORDER — VITAMIN B-12 1000 MCG PO TABS
1000.0000 ug | ORAL_TABLET | Freq: Every day | ORAL | Status: DC
  Administered 2021-11-27 – 2021-11-29 (×3): 1000 ug via ORAL
  Filled 2021-11-27 (×3): qty 1

## 2021-11-27 MED ORDER — ROSUVASTATIN CALCIUM 5 MG PO TABS
5.0000 mg | ORAL_TABLET | Freq: Every day | ORAL | Status: DC
  Administered 2021-11-27 – 2021-11-29 (×3): 5 mg via ORAL
  Filled 2021-11-27 (×3): qty 1

## 2021-11-27 MED ORDER — FELODIPINE ER 5 MG PO TB24
20.0000 mg | ORAL_TABLET | Freq: Every day | ORAL | Status: DC
  Administered 2021-11-27 – 2021-11-29 (×3): 20 mg via ORAL
  Filled 2021-11-27 (×3): qty 4

## 2021-11-27 MED ORDER — AMIODARONE HCL 200 MG PO TABS
200.0000 mg | ORAL_TABLET | Freq: Two times a day (BID) | ORAL | Status: DC
  Administered 2021-11-27: 200 mg via ORAL
  Filled 2021-11-27: qty 1

## 2021-11-27 MED ORDER — FUROSEMIDE 10 MG/ML IJ SOLN
40.0000 mg | Freq: Once | INTRAMUSCULAR | Status: AC
  Administered 2021-11-27: 40 mg via INTRAVENOUS
  Filled 2021-11-27: qty 4

## 2021-11-27 MED ORDER — FELODIPINE ER 10 MG PO TB24
20.0000 mg | ORAL_TABLET | Freq: Every morning | ORAL | Status: DC
  Filled 2021-11-27: qty 2

## 2021-11-27 MED ORDER — FUROSEMIDE 20 MG PO TABS: 20.0000 mg | ORAL_TABLET | Freq: Every day | ORAL | Status: DC

## 2021-11-27 MED ORDER — METOPROLOL SUCCINATE ER 25 MG PO TB24
25.0000 mg | ORAL_TABLET | Freq: Every day | ORAL | Status: DC
  Administered 2021-11-27 – 2021-11-29 (×3): 25 mg via ORAL
  Filled 2021-11-27 (×3): qty 1

## 2021-11-27 NOTE — TOC Progression Note (Signed)
Transition of Care [facility]) - Progression Note    Patient Details  Name: Kristie Gillespie MRN: [id number] Date of Birth: 1925/11/14  Transition of Care [facility]) CM/SW Contact  Zenon Mayo, RN Phone Number: 11/27/2021, 1:54 PM  Clinical Narrative:     Transition of Care [facility]) Screening Note   Patient Details  Name: Kristie Gillespie Date of Birth: Nov 17, 1925   Transition of Care [facility]) CM/SW Contact:    Zenon Mayo, RN Phone Number: 11/27/2021, 1:54 PM    Transition of Care Department [facility]) has reviewed patient and no TOC needs have been identified at this time. We will continue to monitor patient advancement through interdisciplinary progression rounds. If new patient transition needs arise, please place a TOC consult.          Expected Discharge Plan and Services                                                 Social Determinants of Health (SDOH) Interventions    Readmission Risk Interventions No flowsheet data found.

## 2021-11-27 NOTE — ED Notes (Signed)
Admitting Provider at bedside. 

## 2021-11-27 NOTE — Progress Notes (Signed)
Subjective:  Patient continues to have some discomfort in her abdomen. She has not had a bowel movement in several days. She is also nauseous but has not thrown up. Denies any chest pain.   Objective:  Vital signs in last 24 hours: Vitals:   11/27/21 0600 11/27/21 0630 11/27/21 0700 11/27/21 0929  BP: (!) 162/76 (!) 166/67 (!) 178/77 (!) 173/73  Pulse: 63 65 66 70  Resp: 14 17 (!) 21   Temp: 98.8 F (37.1 C)     TempSrc: Oral     SpO2: 95% 97% 96%    Physical Exam  Constitutional: Well-developed, well-nourished, and in no distress.  HENT: Very Hard of hearing  Head: Normocephalic and atraumatic.  Cardiovascular: Normal rate, intermittently in afib and sinus, 2+ radial pulses. No gallop and no friction rub.  No murmur heard. No lower extremity edema  Pulmonary: Non labored breathing on room air, no wheezing or rales  Abdominal: Soft. Normal bowel sounds. Non distended, Diffusely tender abdomen to palpation, non peritonitic, no rebound tenderness or guarding. Diffusely mild tympany  Musculoskeletal: Normal range of motion.        General: No tenderness or edema.  Neurological: Alert and oriented to person, place, and time. Non focal  Skin: Skin is warm and dry.    Assessment/Plan:  Active Problems:   Hypertensive urgency   Nausea without vomiting  #Nausea #Diffuse abdominal pain Unclear exact etiology. Potentially multifactorial. On presentation it was felt that her elevated blood pressures in the setting of her home antihypertensive being held led to her symptoms which began the day after admission. Patient also reports not having a bowel movement in a couple of days, though her KUB was negative for constipation. Patient is noted to have an elevated AST and tbili (though decreased) from admission labs. She does not have focal RUQ pain and her pain is not associated with eating. Although, she could have diffuse nonspecific pain early on. Patient does have flatus and there is  no concern for mechanical obstruction on CT A/P or KUB. Temporally, patient's abdominal pain does appear to coincide with the initiation of amiodarone with can have GI side effects.  -Will give patient suppository to see if a BM will help relieve her symptoms.  -Will obtain a RUQ [place] to look for biliary stones given elevated AST and tbili.  -Continue anti nausea medications -Resume home antihypertensives  Type II NSTEMI HX of MI 2015, medically managed During her last admission, elevated troponins peaked at 500 that trended down to 469, which was felt to be due to demand ischemia in the setting of A-fib with RVR.  Most recent troponin of 228. No EKG findings c/f significant for ischemia.  -Nothing to do  HFpEF, acute on chronic Most recent echo was performed a few days ago, showing EF 60-65% with grade 2 diastolic dysfunction.  She is on Lasix at home for this, though it is unclear which medications she has been compliant with. Had rales R>L on exam and imaging findings of cardiomegaly and interstitial edema on admission and received IV lasix with good urine output. She is currently on room air and does not appear overtly hypervolemic.  - Hold home lasix 20 mg po daily for now - Daily weights - Strict I/O's  HTN Briefly improved, now elevated in the 180s, restarted patient's home losartan and felodipine. Will slowly normalize as patient is without end organ damage.  -Continue to monitor  -If persistently elevated consider PO hydralazine, or IV  labetalol    Paroxysmal Atrial fibrillation Patient intermittently in afib.  CHADS Vasc 4. Amiodarone, Eliquis, and metoprolol were started during her last admission.  - Continue Eliquis 5 mg p.o. twice daily - Continue amiodarone 200 mg twice daily - Continue metoprolol 25 mg daily   Prior to Admission Living Arrangement: Home with daughter  Anticipated Discharge Location: Home w/ daughter  Barriers to Discharge: Safe dispo  Dispo: Anticipated  discharge in approximately 2 day(s).   Rick Duff, MD 11/27/2021, 2:30 PM Pager: [phone] After 5pm on weekdays and 1pm on weekends: On Call pager [phone]

## 2021-11-27 NOTE — Plan of Care (Signed)

## 2021-11-27 NOTE — ED Provider Notes (Signed)
CT imaging is without acute findings.  BP is still elevated.  Discussed with internal medicine service for readmission to their service   Ripley Fraise, MD 11/27/21 [phone]

## 2021-11-27 NOTE — Evaluation (Signed)
Physical Therapy Evaluation Patient Details Name: Kristie Gillespie MRN: [id number] DOB: May 20, 1926 Today's Date: 11/27/2021  History of Present Illness  88 y.o. female presents to [facility] on 11/26/2021 with persistent nausea. PT found to be hypertensive in ED. Pt recently admitted 2/22 with new onset A-fib. PMH includes HTN, HLD, MI, HFpEF.  Clinical Impression  Pt presents to PT with deficits in strength, power, gait, balance, endurance. Pt with mild instability, benefiting from UE support of PT when ambulating. Pt typically mobilizes independently, but has been largely bedridden over the last week since previous hospital admission. Pt will benefit from frequent mobilization in an effort to improve strength and balance. Acute PT to follow.     Recommendations for follow up therapy are one component of a multi-disciplinary discharge planning process, led by the attending physician.  Recommendations may be updated based on patient status, additional functional criteria and insurance authorization.  Follow Up Recommendations No PT follow up    Assistance Recommended at Discharge PRN  Patient can return home with the following  A little help with walking and/or transfers;Help with stairs or ramp for entrance;Assistance with cooking/housework;A little help with bathing/dressing/bathroom    Equipment Recommendations None recommended by PT (pt has access to a walker)  Recommendations for Other Services       Functional Status Assessment Patient has had a recent decline in their functional status and demonstrates the ability to make significant improvements in function in a reasonable and predictable amount of time.     Precautions / Restrictions Precautions Precautions: Fall Restrictions Weight Bearing Restrictions: No      Mobility  Bed Mobility Overal bed mobility: Modified Independent             General bed mobility comments: HOB elevated    Transfers Overall transfer  level: Needs assistance Equipment used: None Transfers: Sit to/from Stand Sit to Stand: Supervision                Ambulation/Gait Ambulation/Gait assistance: Min assist Gait Distance (Feet): 150 Feet Assistive device: 1 person hand held assist Gait Pattern/deviations: Step-through pattern Gait velocity: reduced Gait velocity interpretation: <1.8 ft/sec, indicate of risk for recurrent falls   General Gait Details: pt with slowed step-through gait, increased lateral sway. Pt utilizing PT UE for stability  Stairs            Wheelchair Mobility    Modified Rankin (Stroke Patients Only)       Balance Overall balance assessment: Needs assistance Sitting-balance support: No upper extremity supported, Feet supported Sitting balance-Leahy Scale: Good     Standing balance support: Single extremity supported Standing balance-Leahy Scale: Fair Standing balance comment: fair static balance, minA with dynamic                             Pertinent Vitals/Pain Pain Assessment Pain Assessment: Faces Faces Pain Scale: Hurts little more Pain Location: abdomen Pain Descriptors / Indicators: Grimacing Pain Intervention(s): Monitored during session    Home Living Family/patient expects to be discharged to:: Private residence Living Arrangements: Children Available Help at Discharge: Family Type of Home: House Home Access:  (PT did not clarify, pt does have to negotiate stairs and there is a railing present)       Home Layout:  (PT did not clarify) Home Equipment: Rolling Walker (2 wheels)      Prior Function Prior Level of Function : Independent/Modified Independent  Hand Dominance        Extremity/Trunk Assessment   Upper Extremity Assessment Upper Extremity Assessment: Overall WFL for tasks assessed    Lower Extremity Assessment Lower Extremity Assessment: Generalized weakness    Cervical / Trunk  Assessment Cervical / Trunk Assessment: Kyphotic  Communication   Communication: HOH (unable to hear at this time, communicating via written communication. Pt responds verbally)  Cognition Arousal/Alertness: Awake/alert Behavior During Therapy: WFL for tasks assessed/performed Overall Cognitive Status: Within Functional Limits for tasks assessed                                          General Comments General comments (skin integrity, edema, etc.): VSS on RA    Exercises     Assessment/Plan    PT Assessment Patient needs continued PT services  PT Problem List Decreased strength;Decreased activity tolerance;Decreased balance;Decreased mobility;Cardiopulmonary status limiting activity;Pain       PT Treatment Interventions DME instruction;Gait training;Stair training;Functional mobility training;Therapeutic activities;Therapeutic exercise;Balance training;Neuromuscular re-education;Patient/family education    PT Goals (Current goals can be found in the Care Plan section)  Acute Rehab PT Goals Patient Stated Goal: to return to independent mobility PT Goal Formulation: With patient/family Time For Goal Achievement: 12/11/21 Potential to Achieve Goals: Good    Frequency Min 3X/week     Co-evaluation               AM-PAC PT "6 Clicks" Mobility  Outcome Measure Help needed turning from your back to your side while in a flat bed without using bedrails?: None Help needed moving from lying on your back to sitting on the side of a flat bed without using bedrails?: None Help needed moving to and from a bed to a chair (including a wheelchair)?: A Little Help needed standing up from a chair using your arms (e.g., wheelchair or bedside chair)?: A Little Help needed to walk in hospital room?: A Little Help needed climbing 3-5 steps with a railing? : Total 6 Click Score: 18    End of Session   Activity Tolerance: Patient tolerated treatment well Patient left:  in bed;with call bell/phone within reach;with family/visitor present Nurse Communication: Mobility status PT Visit Diagnosis: Other abnormalities of gait and mobility (R26.89);Muscle weakness (generalized) (M62.81)    Time: 1601-0932 PT Time Calculation (min) (ACUTE ONLY): 20 min   Charges:   PT Evaluation $PT Eval Low Complexity: [place], PT, DPT Acute Rehabilitation Pager: [phone] Office [phone]   Zenaida Niece 11/27/2021, 5:22 PM

## 2021-11-27 NOTE — H&P (Addendum)
Date: 11/27/2021               Patient Name:  Kristie Gillespie MRN: [id number]  DOB: 25-Sep-1926 Age / Sex: 88 y.o., female   PCP: Pcp, No         Medical Service: Internal Medicine Teaching Service         Attending Physician: Dr. Jimmye Norman, Elaina Pattee, MD    First Contact: Dr. Elliot Gurney Pager: [phone]  Second Contact: Dr. Eulas Post Pager: [phone]       After Hours (After 5p/  First Contact Pager: [phone]  weekends / holidays): Second Contact Pager: [phone]   Chief Complaint: elevated BP, abdominal pain, nausea  History of Present Illness: 88 year old female with a hx of HTN, HLD, MI in 2015 managed medically, HFpEF, new-onset Afib who presents to [facility] today with a chief complaint of elevated blood pressure, abdominal pain, and nausea.  History is limited in the setting of patient with difficulty hearing; most of our questions were written down for her to answer.  The patient was discharged from our service yesterday after presenting with new onset A-fib.  She was started on amiodarone and Eliquis with plans to follow-up in the A-fib clinic in 1 week.  For her hypertension, she was instructed to take her irbesartan but hold her felodipine.  The patient endorses a 3-4-day history of nausea.  She says that she vomited mucus once, however she mostly endorses needing to vomit without actually vomiting.  Additionally, her blood pressures were elevated at home.  Patient states that some of her blood pressures were in the 235T systolic.  One of her other main complaints is abdominal pain, which she has had since her last admission.  It is located across the lower part of her abdomen but does not radiate anywhere.  She describes it as a constant, dull ache.  She also endorses feeling more bloated than usual.  Has had 1 small bowel movement since she has been here, otherwise has not had a bowel movement since Monday.  No blood in her stool.  No dysuria or hematuria.  No numbness/tingling.  No other  complaints or concerns today.  Meds:  No outpatient medications have been marked as taking for the 11/26/21 encounter [facility] Encounter).    Allergies: Allergies as of 11/26/2021 - Review Complete 11/24/2021  Allergen Reaction Noted   Diltiazem Nausea Only 11/24/2021   Prednisone Other (See Comments) 11/24/2021   Amoxicillin Rash 11/24/2021   Tetracyclines & related Rash 11/24/2021   Past Medical History:  Diagnosis Date   HLD (hyperlipidemia)    HTN (hypertension)    MI (myocardial infarction) ([place]) 2015   Managed medically   Skin cancer     Family History: No family history on file.  Social History: She is a retired Network engineer. Patient lives with her daughter who helps around the house, including with cooking meals.  Patient manages her medications by herself.  They live in a condo.  Patient states that she has a walker she can use, however she has rails on the condo to keep her steady. Her PCP is [facility].   Review of Systems: A complete ROS was negative except as per HPI.   Physical Exam: Blood pressure (!) 196/72, pulse 77, temperature 98.2 F (36.8 C), temperature source Oral, resp. rate (!) 21, SpO2 94 %. General: NAD, nl appearance HE: Normocephalic, atraumatic, EOMI, Conjunctivae normal ENT: No congestion, no rhinorrhea, no exudate or erythema, dry mucous membranes  Cardiovascular: Normal rate, regular rhythm. No murmurs, rubs, or gallops Pulmonary: Effort normal, Bibasilar crackles R>L Abdominal: soft, bowel sounds present, TTP in LLQ, suprapubic, and RLQ regions. No other abdominal TTP. No rebound/guarding Musculoskeletal: no swelling, deformity, injury or tenderness in extremities Skin: Warm, dry, no bruising, erythema, or rash Psychiatric/Behavioral: normal mood, normal behavior     EKG: personally reviewed my interpretation is NSR,   CT Angio Chest/Abd/Pel for Dissection W and/or Wo Contrast  IMPRESSION: 1. Aortic and branch vessel atherosclerosis  without aortic aneurysm, dissection or stenosis. 2. Right renal artery and IMA stenoses as described above. No other flow-limiting visceral artery stenosis. No inflow stenosis. 3. Cardiomegaly with biatrial enlargement, mild interstitial edema in the bases and small layering pleural effusions. Findings consistent with CHF or fluid overload. There is no body wall edema. Only trace ascites in the posterior deep pelvis. 4. IVC and hepatic vein contrast reflux consistent with right heart dysfunction, tricuspid regurgitation or rapid contrast injection. 5. Slightly prominent bilateral hilar lymph nodes and borderline size mediastinal nodes. No bulky adenopathy. 6. Upper and lower lobe bronchitic features. 7. Ground-glass opacities in the lung fields, predominantly centrally distributed and could indicate ground-glass edema or pneumonitis/bronchopneumonia. 8. Some images may suggest faint perivesical edema. Query early cystitis. No findings of acute pyelonephritis. 9. Prominent common bile duct, but could be normal for age. Laboratory and clinical correlation suggested. 10. Thoracic kyphosis with mild, chronic appearing anterior wedge deformities of the T6, T7 and L1 vertebrae. Osteopenia and degenerative change. 11. Diverticulosis without evidence of diverticulitis and additional findings described above.  CT Head WO Contrast:  IMPRESSION: Atrophy, vascular calcifications and small-vessel disease. No acute intracranial CT findings. Sinus membrane thickening.   Assessment & Plan by Problem: Principal Problem:   Hypertensive urgency  Severe symptomatic hypertension Per review of records, the patient is on irbesartan 300 mg daily and felodipine 20 mg daily for her blood pressure.  She was asked to hold felodipine per cardiology at discharge.  However, the patient reports that she manages her medications herself, and it is unclear if she is truly taking her blood pressure medications at  home.  Her main presenting symptoms are nausea and abdominal pain, which could certainly be the result of her elevated BP. In the ED, her blood pressure was 215/94 and RR 30, regular rate and rhythm.  Has received labetalol in the ED so far. Notable lab work includes AST 43, total bilirubin 3.4, troponin 228. Many emergent underlying findings, such as an aortic dissection, stroke, or head injury, have been ruled out on imaging thus far. Could have associated HF exacerbation with rales on exam and imaging findings suggestive of fluid overload. Other workup to assess for end-organ damage as below. - Continue irbesartan 300 mg daily - Continue Lasix 20 mg daily - BNP pending - UA and urine culture pending - CMP, lactic acid  Type II NSTEMI HX of MI 2015, medically managed During her last admission, elevated troponins peaked at 500 and trended down to 469, which was felt to be due to demand ischemia in the setting of A-fib with RVR.  Most recent troponin of 228 in the ED.  EKG findings show no significant ST changes. - Trend troponins  HFpEF, acute on chronic Most recent echo was performed few days ago, showing EF 60-65% with G2 DD.  She is on Lasix at home for this, though it is unclear which medications she has been compliant with. Has rales R>L on exam and imaging findings  of cardiomegaly and interstitial edema. - Continue lasix 20 mg po daily - Daily weights - Strict I/O's  Atrial fibrillation Patient is currently in NSR. Amiodarone, Eliquis, and metoprolol were started during her last admission. - Continue Eliquis 5 mg p.o. twice daily - Continue amiodarone 200 mg twice daily - Continue metoprolol 25 mg daily - Telemetry  HLD - Continue crestor 5 mg po daily   Dispo: Admit patient to Observation with expected length of stay less than 2 midnights.  Signed: Orvis Brill, MD 11/27/2021, 2:57 AM  Pager: [phone] After 5pm on weekdays and 1pm on weekends: On Call pager:  [phone]

## 2021-11-27 NOTE — ED Notes (Signed)
Patient transported to CT 

## 2021-11-27 NOTE — ED Provider Notes (Signed)
Plan is f/u on imaging/labs and admit    Kristie Fraise, MD 11/27/21 0006

## 2021-11-27 NOTE — ED Notes (Signed)
Breakfast Order Placed ?

## 2021-11-28 ENCOUNTER — Observation Stay (HOSPITAL_COMMUNITY): Payer: Federal, State, Local not specified - PPO

## 2021-11-28 DIAGNOSIS — R110 Nausea: Secondary | ICD-10-CM

## 2021-11-28 DIAGNOSIS — I21A1 Myocardial infarction type 2: Secondary | ICD-10-CM

## 2021-11-28 DIAGNOSIS — I161 Hypertensive emergency: Secondary | ICD-10-CM

## 2021-11-28 DIAGNOSIS — I110 Hypertensive heart disease with heart failure: Secondary | ICD-10-CM | POA: Diagnosis not present

## 2021-11-28 DIAGNOSIS — I5032 Chronic diastolic (congestive) heart failure: Secondary | ICD-10-CM

## 2021-11-28 DIAGNOSIS — I160 Hypertensive urgency: Secondary | ICD-10-CM | POA: Diagnosis not present

## 2021-11-28 DIAGNOSIS — I480 Paroxysmal atrial fibrillation: Secondary | ICD-10-CM

## 2021-11-28 LAB — COMPREHENSIVE METABOLIC PANEL
ALT: 19 U/L (ref 0–44)
AST: 23 U/L (ref 15–41)
Albumin: 3.2 g/dL — ABNORMAL LOW (ref 3.5–5.0)
Alkaline Phosphatase: 57 U/L (ref 38–126)
Anion gap: 13 (ref 5–15)
BUN: 21 mg/dL (ref 8–23)
CO2: 24 mmol/L (ref 22–32)
Calcium: 9 mg/dL (ref 8.9–10.3)
Chloride: 99 mmol/L (ref 98–111)
Creatinine, Ser: 1.03 mg/dL — ABNORMAL HIGH (ref 0.44–1.00)
GFR, Estimated: 51 mL/min — ABNORMAL LOW (ref >60)
Glucose, Bld: 94 mg/dL (ref 70–99)
Potassium: 3.4 mmol/L — ABNORMAL LOW (ref 3.5–5.1)
Sodium: 136 mmol/L (ref 135–145)
Total Bilirubin: 2 mg/dL — ABNORMAL HIGH (ref 0.3–1.2)
Total Protein: 5.9 g/dL — ABNORMAL LOW (ref 6.5–8.1)

## 2021-11-28 LAB — GLUCOSE, CAPILLARY: Glucose-Capillary: 94 mg/dL (ref 70–99)

## 2021-11-28 LAB — CBC
HCT: 40.4 % (ref 36.0–46.0)
Hemoglobin: 13.6 g/dL (ref 12.0–15.0)
MCH: 33.7 pg (ref 26.0–34.0)
MCHC: 33.7 g/dL (ref 30.0–36.0)
MCV: 100 fL (ref 80.0–100.0)
Platelets: 180 10*3/uL (ref 150–400)
RBC: 4.04 MIL/uL (ref 3.87–5.11)
RDW: 12.8 % (ref 11.5–15.5)
WBC: 9.2 10*3/uL (ref 4.0–10.5)
nRBC: 0 % (ref 0.0–0.2)

## 2021-11-28 LAB — URINE CULTURE

## 2021-11-28 NOTE — Evaluation (Signed)
Occupational Therapy Evaluation Patient Details Name: Kristie Gillespie MRN: [id number] DOB: 1926/06/17 Today's Date: 11/28/2021   History of Present Illness 88 y.o. female presents to [facility] hospital on 11/26/2021 with persistent nausea. PT found to be hypertensive in ED. Pt recently admitted 2/22 with new onset A-fib. PMH includes HTN, HLD, MI, HFpEF.   Clinical Impression   Pt/daughter report pt is independent at baseline with ADLs and functional mobility. Lives with daughter who is able to provide assist at home as needed. Pt currently requiring supervision - min guard for ADLs, mod I for bed mobility and supervision for transfers. Pt able to ambulate to bathroom for toilet transfer, denies pain or dizziness, VSS on RA. Pt presenting with impairments listed below, will follow acutely. Recommend d/c home with assistance.      Recommendations for follow up therapy are one component of a multi-disciplinary discharge planning process, led by the attending physician.  Recommendations may be updated based on patient status, additional functional criteria and insurance authorization.   Follow Up Recommendations  No OT follow up    Assistance Recommended at Discharge Set up Supervision/Assistance  Patient can return home with the following A little help with bathing/dressing/bathroom;A little help with walking and/or transfers;Help with stairs or ramp for entrance    Functional Status Assessment  Patient has had a recent decline in their functional status and demonstrates the ability to make significant improvements in function in a reasonable and predictable amount of time.  Equipment Recommendations  None recommended by OT    Recommendations for Other Services       Precautions / Restrictions Precautions Precautions: Fall Restrictions Weight Bearing Restrictions: No      Mobility Bed Mobility Overal bed mobility: Modified Independent             General bed mobility comments: HOB  elevated    Transfers Overall transfer level: Needs assistance Equipment used: None Transfers: Sit to/from Stand Sit to Stand: Supervision                  Balance Overall balance assessment: Needs assistance Sitting-balance support: No upper extremity supported, Feet supported Sitting balance-Leahy Scale: Good     Standing balance support: Single extremity supported Standing balance-Leahy Scale: Fair Standing balance comment: able to stand statically without LOB                           ADL either performed or assessed with clinical judgement   ADL Overall ADL's : Needs assistance/impaired Eating/Feeding: Set up;Sitting   Grooming: Set up;Sitting;Standing   Upper Body Bathing: Supervision/ safety;Sitting   Lower Body Bathing: Supervison/ safety;Sitting/lateral leans   Upper Body Dressing : Sitting;Set up   Lower Body Dressing: Minimal assistance;Sitting/lateral leans   Toilet Transfer: Nature conservation officer;Ambulation   Toileting- Clothing Manipulation and Hygiene: Supervision/safety;Sitting/lateral lean Toileting - Clothing Manipulation Details (indicate cue type and reason): to move clothing prior to sitting on commode     Functional mobility during ADLs: Min guard       Vision   Vision Assessment?: No apparent visual deficits     Perception     Praxis      Pertinent Vitals/Pain Pain Assessment Pain Assessment: No/denies pain     Hand Dominance     Extremity/Trunk Assessment Upper Extremity Assessment Upper Extremity Assessment: Overall WFL for tasks assessed   Lower Extremity Assessment Lower Extremity Assessment: Defer to PT evaluation   Cervical / Trunk Assessment Cervical /  Trunk Assessment: Kyphotic   Communication Communication Communication: HOH   Cognition Arousal/Alertness: Awake/alert Behavior During Therapy: WFL for tasks assessed/performed Overall Cognitive Status: Within Functional Limits for tasks  assessed                                       General Comments  VSS on RA    Exercises     Shoulder Instructions      Home Living Family/patient expects to be discharged to:: Private residence Living Arrangements: Children Available Help at Discharge: Family Type of Home: House Home Access: Stairs to enter Technical brewer of Steps: 1 Entrance Stairs-Rails: None Home Layout: Two level;Able to live on main level with bedroom/bathroom     Bathroom Shower/Tub: Tub/shower unit;Sponge bathes at baseline   [employer]: Standard (has toilet riser) Bathroom Accessibility: Yes How Accessible: Accessible via walker Home Equipment: [place] (2 wheels);Shower seat   Additional Comments: daughter lives with pt, provided home living setup due to pt's [facility]      Prior Functioning/Environment Prior Level of Function : Independent/Modified Independent             Mobility Comments: does not use AD ADLs Comments: does IADLs        OT Problem List: Decreased activity tolerance;Impaired balance (sitting and/or standing)      OT Treatment/Interventions: Self-care/ADL training;Therapeutic exercise;Energy conservation;Therapeutic activities;Patient/family education;Balance training;DME and/or AE instruction    OT Goals(Current goals can be found in the care plan section) Acute Rehab OT Goals Patient Stated Goal: none stated OT Goal Formulation: With patient Time For Goal Achievement: 12/12/21 Potential to Achieve Goals: Good ADL Goals Pt Will Perform Upper Body Dressing: with modified independence;sitting Pt Will Perform Lower Body Dressing: with modified independence;sit to/from stand;sitting/lateral leans Pt Will Transfer to Toilet: with modified independence;ambulating;regular height toilet Pt Will Perform Tub/Shower Transfer: with supervision;ambulating  OT Frequency: Min 2X/week    Co-evaluation              AM-PAC OT "6 Clicks"  Daily Activity     Outcome Measure Help from another person eating meals?: None Help from another person taking care of personal grooming?: None Help from another person toileting, which includes using toliet, bedpan, or urinal?: A Little Help from another person bathing (including washing, rinsing, drying)?: A Little Help from another person to put on and taking off regular upper body clothing?: A Little Help from another person to put on and taking off regular lower body clothing?: A Little 6 Click Score: 20   End of Session Equipment Utilized During Treatment: Gait belt  Activity Tolerance: Patient tolerated treatment well Patient left: in bed;with call bell/phone within reach;with bed alarm set;with family/visitor present  OT Visit Diagnosis: Unsteadiness on feet (R26.81);Other abnormalities of gait and mobility (R26.89);Muscle weakness (generalized) (M62.81)                Time: 2409-7353 OT Time Calculation (min): 23 min Charges:  OT General Charges $OT Visit: 1 Visit OT Evaluation $OT Eval Low Complexity: 1 Low OT Treatments $Self Care/Home Management : 8-22 mins  Lynnda Child, OTD, OTR/L Acute Rehab [phone]) [phone] - [place] 11/28/2021, 9:52 AM

## 2021-11-28 NOTE — Progress Notes (Signed)
Subjective:  Feeling improved this morning. Not having any nausea, vomiting, or abd pain. She has been tolerating some PO intake. Had normal BM She denies any chest pain or SOB.   Objective:  Vital signs in last 24 hours: Vitals:   11/27/21 1634 11/27/21 1949 11/28/21 0012 11/28/21 0431  BP:  (!) 118/52 122/63 130/66  Pulse:  70 72 70  Resp:  19 17 16   Temp:  98.3 F (36.8 C) (!) 97.5 F (36.4 C) 97.9 F (36.6 C)  TempSrc:  Oral Oral Oral  SpO2:  94% 93% 90%  Weight:    65.7 kg  Height: 5\' 2"  (1.575 m)      Physical Exam  Constitutional: Well-developed, well-nourished, and in no distress.  HENT: Very Hard of hearing  Head: Normocephalic and atraumatic.  Cardiovascular: Normal rate, 2+ radial pulses. No gallop and no friction rub.  No murmur heard. No lower extremity edema  Pulmonary: Non labored breathing on room air, no wheezing or rales  Abdominal: Soft. Normal bowel sounds. Non distended, nontender abdomen to palpation, non peritonitic, no rebound tenderness or guarding.  Musculoskeletal: Normal range of motion.        General: No tenderness or edema.  Neurological: Alert and oriented to person, place, and time. Non focal  Skin: Skin is warm and dry.   Assessment/Plan:  Active Problems:   Hypertensive urgency   Nausea without vomiting  #Nausea #Diffuse abdominal pain - resolving. No abd pain N/V today. She is tolerating PO.  Unclear exact etiology. Potentially multifactorial. On presentation it was felt that her elevated blood pressures in the setting of her home antihypertensive being held led to her symptoms which began the day after admission. Patient also reports not having a bowel movement in a couple of days, though her KUB was negative for constipation. Patient is noted to have an elevated AST and tbili (though decreased) from admission labs. She does not have focal RUQ pain and her pain is not associated with eating. RUQ  [place] was obtained and ruled out biliary  causes.  Patient does have flatus and there is no concern for mechanical obstruction on CT A/P or KUB. Temporally, patient's abdominal pain does appear to coincide with the initiation of amiodarone with can have GI side effects.   - symptoms have resolved since holding amiodarone.  -Continue anti nausea medications -Resume home antihypertensives - if she is tolerating PO intake with no NV, she may be able to discharge home tomorrow.   Type II NSTEMI HX of MI 2015, medically managed During her last admission, elevated troponins peaked at 500 that trended down to 469, which was felt to be due to demand ischemia in the setting of A-fib with RVR.  Most recent troponin of 228. No EKG findings c/f significant for ischemia.   HFpEF, acute on chronic Most recent echo was performed a few days ago, showing EF 60-65% with grade 2 diastolic dysfunction.  She is on Lasix at home for this, though it is unclear which medications she has been compliant with. Had rales R>L on exam and imaging findings of cardiomegaly and interstitial edema on admission and received IV lasix with good urine output. She is currently on room air and does not appear overtly hypervolemic.  - Hold home lasix 20 mg po daily for now - Daily weights - Strict I/O's  HTN - BP has been stable since initiating home BP meds. We will continue these for now.  -If persistently elevated consider PO  hydralazine, or IV labetalol    Paroxysmal Atrial fibrillation Patient intermittently in afib.  CHADS Vasc 4. Amiodarone, Eliquis, and metoprolol were started during her last admission.  - Continue Eliquis 5 mg p.o. twice daily - Continue metoprolol 25 mg daily - amiodarone being held due to suspected GI side effects.    Prior to Admission Living Arrangement: Home with daughter  Anticipated Discharge Location: Home w/ daughter  Barriers to Discharge: Safe dispo  Dispo: Anticipated discharge in approximately 2 day(s).   Delene Ruffini,  MD 11/28/2021, 6:11 AM Pager: [phone] After 5pm on weekdays and 1pm on weekends: On Call pager [phone]

## 2021-11-29 DIAGNOSIS — I160 Hypertensive urgency: Secondary | ICD-10-CM | POA: Diagnosis not present

## 2021-11-29 LAB — BASIC METABOLIC PANEL
Anion gap: 9 (ref 5–15)
BUN: 22 mg/dL (ref 8–23)
CO2: 29 mmol/L (ref 22–32)
Calcium: 8.8 mg/dL — ABNORMAL LOW (ref 8.9–10.3)
Chloride: 98 mmol/L (ref 98–111)
Creatinine, Ser: 0.84 mg/dL (ref 0.44–1.00)
GFR, Estimated: >60 mL/min (ref >60)
Glucose, Bld: 97 mg/dL (ref 70–99)
Potassium: 3.1 mmol/L — ABNORMAL LOW (ref 3.5–5.1)
Sodium: 136 mmol/L (ref 135–145)

## 2021-11-29 LAB — GLUCOSE, CAPILLARY: Glucose-Capillary: 108 mg/dL — ABNORMAL HIGH (ref 70–99)

## 2021-11-29 MED ORDER — POTASSIUM CHLORIDE CRYS ER 20 MEQ PO TBCR
40.0000 meq | EXTENDED_RELEASE_TABLET | Freq: Two times a day (BID) | ORAL | Status: DC
  Administered 2021-11-29: 40 meq via ORAL
  Filled 2021-11-29: qty 2

## 2021-11-29 NOTE — Telephone Encounter (Signed)
Left message for daughter, Oliva Bustard, regarding Dr. Theodosia Blender recommendation to check patient's blood pressure and heart rate every morning and at dinnertime and to call with results in one week. Also to call our office sooner if BP remains persistently elevated.  Provided office number for callback for results and any questions she may have.

## 2021-11-29 NOTE — Telephone Encounter (Signed)
Patient's daughter, Curt Bears, returned call from this morning and states she took patient to the hospital over the weekend due to elevated blood pressure readings.   Patient currently in [place].

## 2021-11-29 NOTE — Discharge Summary (Signed)
Name: Kristie Gillespie MRN: [id number] DOB: [birth year] 88 y.o. PCP: Pcp, No  Date of Admission: 11/26/2021  8:46 PM Date of Discharge: 11/29/21 Attending Physician: Dr. Philipp Ovens  Discharge Diagnosis: Active Problems:   Hypertensive urgency   Nausea without vomiting    Discharge Medications: Allergies as of 11/29/2021       Reactions   Diltiazem Nausea Only   Prednisone Other (See Comments)   Unknown reaction   Amoxicillin Rash   Tetracyclines & Related Rash        Medication List     STOP taking these medications    amiodarone 200 MG tablet Commonly known as: PACERONE       TAKE these medications    acetaminophen 650 MG CR tablet Commonly known as: TYLENOL Take 650 mg by mouth at bedtime.   CORICIDIN HBP PO Take 1 tablet by mouth every morning.   Eliquis 5 MG Tabs tablet Generic drug: apixaban Take 1 tablet (5 mg total) by mouth 2 (two) times daily.   felodipine 10 MG 24 hr tablet Commonly known as: PLENDIL Take 20 mg by mouth every morning.   furosemide 20 MG tablet Commonly known as: LASIX Take 20 mg by mouth every morning.   irbesartan 300 MG tablet Commonly known as: AVAPRO Take 300 mg by mouth daily.   Klor-Con M20 20 MEQ tablet Generic drug: potassium chloride SA Take 20 mEq by mouth every morning.   metoprolol succinate 25 MG 24 hr tablet Commonly known as: TOPROL-XL Take 1 tablet (25 mg total) by mouth daily.   rosuvastatin 5 MG tablet Commonly known as: CRESTOR Take 1 tablet (5 mg total) by mouth daily. What changed: when to take this   vitamin B-12 1000 MCG tablet Commonly known as: CYANOCOBALAMIN Take 1,000 mcg by mouth daily.        Disposition and follow-up:   Kristie Gillespie was discharged from [facility] in Stable condition.  At the hospital follow up visit please address:  1.  Follow-up:  a. Nausea ad vomiting - thought secondary to amiodarone.     b. Htn - restarted home meds   c. Afib  - amiodarone held - follow up in afib clinic.   2.  Labs / imaging needed at time of follow-up: bmp  3.  Pending labs/ test needing follow-up: none  4.  Medication Changes  Stopped: amiodarone   Follow-up Appointments: afib clinic 12/03/21  Hospital Course by problem list:   Abd pain/ N/V - patient presented with complaints of nausea, vomiting, and abd pain. She was evaluated with CT A/P, KUB and ED which were negative. She did have slight elevation in liver enzymes, RUQ [place] was obtained and negative as well. Symptoms were thought to be related to her recent amiodarone use. This was discontinued, and she showed symptomatic improvement. She was able to tolerate PO intake, nausea resolved, no abd pain, and was able to move her bowels well. She was felt stable for discharge with close follow up.   HTN urgency - patient was noted to have significantly elevated BP on presentation to ED. Her felodipine had been held upon discharge during prior hospitalization .This was restarted along with her Avapro and metoprolol. Blood pressures remained stable and she was discharged home on these medications.   A flutter - patient's amiodarone was stopped due to concerns of nausea and vomiting. She was monitored on telemetry initially, however this was later discontinued. Her heart rate did remain controlled. She  was anticoagulated with eliquis. No symptoms either and able to climb stairs without feeling out of breath. She was discharged with instruction to hold amiodarone and close follow up with afib clinic.     Discharge Subjective: Feeling improved. No abd pain. Eating well. Not feeling SHOB. Abe to ambulate up and down stairs. Regular BM.   Discharge Exam:   BP (!) 100/53 (BP Location: Right Arm)    Pulse 64    Temp 97.8 F (36.6 C) (Oral)    Resp 18    Ht 5\' 2"  (1.575 m)    Wt 65.5 kg    SpO2 96%    BMI 26.41 kg/m  Constitutional: Well-developed, well-nourished, and in no distress.  HENT: Very Hard  of hearing  Head: Normocephalic and atraumatic.  Cardiovascular: Normal rate, 2+ radial pulses. No gallop and no friction rub.  No murmur heard. No lower extremity edema  Pulmonary: Non labored breathing on room air, no wheezing or rales  Abdominal: Soft. Normal bowel sounds. Non distended, nontender abdomen to palpation, non peritonitic, no rebound tenderness or guarding.  Musculoskeletal: Normal range of motion.        General: No tenderness or edema.  Neurological: Alert and oriented to person, place, and time. Non focal  Skin: Skin is warm and dry.   Pertinent Labs, Studies, and Procedures:  CBC Latest Ref Rng & Units 11/28/2021 11/26/2021 11/25/2021  WBC 4.0 - 10.5 K/uL 9.2 9.3 7.6  Hemoglobin 12.0 - 15.0 g/dL 13.6 12.9 12.4  Hematocrit 36.0 - 46.0 % 40.4 39.3 37.0  Platelets 150 - 400 K/uL 180 146(L) 131(L)    CMP Latest Ref Rng & Units 11/29/2021 11/28/2021 11/27/2021  Glucose 70 - 99 mg/dL 97 94 133(H)  BUN 8 - 23 mg/dL 22 21 13   Creatinine 0.44 - 1.00 mg/dL 0.84 1.03(H) 0.75  Sodium 135 - 145 mmol/L 136 136 136  Potassium 3.5 - 5.1 mmol/L 3.1(L) 3.4(L) 4.8  Chloride 98 - 111 mmol/L 98 99 101  CO2 22 - 32 mmol/L 29 24 24   Calcium 8.9 - 10.3 mg/dL 8.8(L) 9.0 9.0  Total Protein 6.5 - 8.1 g/dL - 5.9(L) 6.3(L)  Total Bilirubin 0.3 - 1.2 mg/dL - 2.0(H) 2.9(H)  Alkaline Phos 38 - 126 U/L - 57 64  AST 15 - 41 U/L - 23 40  ALT 0 - 44 U/L - 19 22    DG Abd 1 View  Result Date: 11/27/2021 CLINICAL DATA:  Constipation EXAM: ABDOMEN - 1 VIEW COMPARISON:  Same day CT FINDINGS: The bowel gas pattern is normal. No abnormal fecal retention. Excreted contrast is noted within the right renal collecting system and urinary bladder. No radio-opaque calculi or other significant radiographic abnormality are seen. IMPRESSION: Negative. Electronically Signed   By: Davina Poke D.O.   On: 11/27/2021 14:29   CT HEAD WO CONTRAST (5MM)  Result Date: 11/27/2021 CLINICAL DATA:  Headache on blood  thinners. Blood pressure over 200. Nausea. EXAM: CT HEAD WITHOUT CONTRAST TECHNIQUE: Contiguous axial images were obtained from the base of the skull through the vertex without intravenous contrast. RADIATION DOSE REDUCTION: This exam was performed according to the departmental dose-optimization program which includes automated exposure control, adjustment of the mA and/or kV according to patient size and/or use of iterative reconstruction technique. COMPARISON:  None. FINDINGS: Brain: There is moderate global atrophy with atrophic ventriculomegaly and mild-to-moderate small vessel disease of the cerebral white matter. Senescent mineralization noted in the basal ganglia. There is no midline shift. Additional  mineralization of the dentate cerebellar nuclei. No asymmetry is seen worrisome for acute infarct, hemorrhage, mass or mass effect. There are few tiny chronic bilateral gangliocapsular lacunar infarcts. Vascular: There are scattered calcifications in the carotid siphons but no hyperdense central vasculature. Skull: Osteopenia.  The calvarium, skull base and orbits are intact. Sinuses/Orbits: There is mild membrane thickening in the sinuses without fluid level. Right-sided nasal septal deviation and spurring. Old right mastoidectomy with the mastoid air cells bilaterally clear. There are old lens replacements. Other: None. IMPRESSION: Atrophy, vascular calcifications and small-vessel disease. No acute intracranial CT findings. Sinus membrane thickening. Electronically Signed   By: Telford Nab M.D.   On: 11/27/2021 01:34   CT Angio Chest/Abd/Pel for Dissection W and/or Wo Contrast  Result Date: 11/27/2021 CLINICAL DATA:  Chest and back pain. Systolic blood pressure over 200. Dissection suspected. EXAM: CT ANGIOGRAPHY CHEST, ABDOMEN AND PELVIS TECHNIQUE: Non-contrast CT of the chest was initially obtained. Multidetector CT imaging through the chest, abdomen and pelvis was performed using the standard  protocol during bolus administration of intravenous contrast. Multiplanar reconstructed images and MIPs were obtained and reviewed to evaluate the vascular anatomy. RADIATION DOSE REDUCTION: This exam was performed according to the departmental dose-optimization program which includes automated exposure control, adjustment of the mA and/or kV according to patient size and/or use of iterative reconstruction technique. CONTRAST:  137mL OMNIPAQUE IOHEXOL 350 MG/ML SOLN COMPARISON:  Only body imaging comparison is right upper quadrant ultrasound 01/04/2021, with only significant finding being hepatic steatosis. There is no prior CT. FINDINGS: Factors affecting image quality: The patient's arms in the field causing streak artifact particularly in the lower chest and through the abdomen, limiting fine detail in these areas. There is also respiratory motion artifact. CTA CHEST FINDINGS Cardiovascular: There is mild panchamber cardiomegaly. Trace calcification noted LAD coronary artery only. There is no pericardial effusion. Pulmonary veins are slightly distended. There is biatrial prominence. RV/LV ratio is 1.0. There are calcifications in the inferior mitral ring. There is homogeneous aortic enhancement without aneurysm or dissection. Thoracic aorta is tortuous with moderate calcific atherosclerosis with scattered atherosclerosis in the branch arteries without flow-limiting stenosis. Pulmonary arteries are normal caliber and centrally clear. There is IVC and hepatic vein contrast reflux. Mediastinum/Nodes: There are few bilateral slightly prominent lymph nodes in both hila, and a few borderline size mediastinal lymph nodes in the precarinal and subcarinal spaces but there is no bulky or encasing adenopathy. No thyroid or axillary mass is seen. There is mild thickening of the distal thoracic esophagus. There are calcified right hilar nodes. The trachea is clear. Lungs/Pleura: There are small layering bilateral pleural  effusions. There are mild features of interstitial edema the base of both lungs. There is posterior atelectasis in the lower lobes, and bilateral upper and lower lobe bronchial thickening. There are faint ground-glass opacities in both upper lobes, infrahilar lower lobes which could be ground-glass edema or pneumonitis. There is no dense area of consolidation. No pulmonary mass is seen. The central airways are patent. Musculoskeletal: There is osteopenia with degenerative changes of the thoracic spine. There is mild thoracic kyphosis, and mild anterior wedging of the T6 and 7 vertebral bodies with a chronic appearance. No acute spinal compression injury is suspected. Other thoracic vertebra are normal in heights. Ribcage is intact with old healed fracture deformities of some of the anterior bilateral ribs noted. Review of the MIP images confirms the above findings. CTA ABDOMEN AND PELVIS FINDINGS VASCULAR Aorta: Tortuous with moderate patchy calcific plaques.  There is no penetrating ulcer. There is no aneurysm or stenosis. Celiac: There are nonstenosing ostial calcifications. There is no stenosis, aneurysm or dissection. SMA: There are nonstenosing ostial calcifications. There is no stenosis, aneurysm or dissection. Renals: There patchy calcifications in the bilateral proximal renal arteries. On the right this is associated with a 50% stenosis in the vessel origin and proximally 80% focal stenosis 1.8 cm from the vessel origin. On the left the calcifications are nonstenosing. Both renal arteries are single. There is no aneurysm or dissection. IMA: High-grade calcific origin stenosis. Rest of the vessel is clear. Inflow: No flow-limiting stenoses bilaterally. There is scattered calcification in the common iliac arteries, trace calcifications in the external and internal iliac arteries. Veins: No obvious venous abnormality within the limitations of this arterial phase study. Review of the MIP images confirms the above  findings. NON-VASCULAR Hepatobiliary: No focal liver abnormality is seen through the breathing motion artifact. There is either dense sludge or gravel in the gallbladder but no wall thickening. There is a prominent 7.3 mm common bile duct with this could be normal in a 87 year old. There is no intrahepatic biliary dilatation. Pancreas: Unremarkable. Spleen: No enlargement or focal abnormality is visible through the breathing motion. Adrenals/Urinary Tract: There is no adrenal mass. There are small renal cysts. Minimal bilateral perinephric fluid. There is no mass enhancement, stones or hydronephrosis. There is no frank bladder thickening but some images may suggest faint perivesical edema. Stomach/Bowel: The gastric wall is contracted. The unopacified small bowel is unremarkable. Appendix is normal caliber. There are colonic diverticula without evidence of acute colitis or diverticulitis, most numerous diverticula in the sigmoid segment. Lymphatic: No adenopathy is seen. Reproductive: Uterus and bilateral adnexa are unremarkable. Other: There is trace ascites in the posterior deep pelvis. Small umbilical fat hernia without incarcerated hernia. There is no free air or free hemorrhage. Musculoskeletal: There is mild anterior wedging of the L1 vertebral body which has a chronic appearance. There is osteopenia and degenerative change of the lumbar spine and discogenic grade 1 retrolisthesis at both L1-2 and L2-3. Mild levorotary lumbar scoliosis. No acute regional skeletal findings no destructive lesions. Review of the MIP images confirms the above findings. IMPRESSION: 1. Aortic and branch vessel atherosclerosis without aortic aneurysm, dissection or stenosis. 2. Right renal artery and IMA stenoses as described above. No other flow-limiting visceral artery stenosis. No inflow stenosis. 3. Cardiomegaly with biatrial enlargement, mild interstitial edema in the bases and small layering pleural effusions. Findings  consistent with CHF or fluid overload. There is no body wall edema. Only trace ascites in the posterior deep pelvis. 4. IVC and hepatic vein contrast reflux consistent with right heart dysfunction, tricuspid regurgitation or rapid contrast injection. 5. Slightly prominent bilateral hilar lymph nodes and borderline size mediastinal nodes. No bulky adenopathy. 6. Upper and lower lobe bronchitic features. 7. Ground-glass opacities in the lung fields, predominantly centrally distributed and could indicate ground-glass edema or pneumonitis/bronchopneumonia. 8. Some images may suggest faint perivesical edema. Query early cystitis. No findings of acute pyelonephritis. 9. Prominent common bile duct, but could be normal for age. Laboratory and clinical correlation suggested. 10. Thoracic kyphosis with mild, chronic appearing anterior wedge deformities of the T6, T7 and L1 vertebrae. Osteopenia and degenerative change. 11. Diverticulosis without evidence of diverticulitis and additional findings described above. Electronically Signed   By: Telford Nab M.D.   On: 11/27/2021 01:30     Discharge Instructions: Discharge Instructions     Call MD for:  difficulty breathing,  headache or visual disturbances   Complete by: As directed    Call MD for:  extreme fatigue   Complete by: As directed    Call MD for:  hives   Complete by: As directed    Call MD for:  persistant dizziness or light-headedness   Complete by: As directed    Call MD for:  persistant nausea and vomiting   Complete by: As directed    Call MD for:  redness, tenderness, or signs of infection (pain, swelling, redness, odor or green/yellow discharge around incision site)   Complete by: As directed    Call MD for:  severe uncontrolled pain   Complete by: As directed    Call MD for:  temperature >100.4   Complete by: As directed    Diet - low sodium heart healthy   Complete by: As directed    Increase activity slowly   Complete by: As directed         Signed: Delene Ruffini, MD 11/29/2021, 7:06 PM   Pager: [phone]

## 2021-11-29 NOTE — Discharge Instructions (Addendum)
Dear Kristie Gillespie,  Thank you for trusting [place] with your care. We treated you in the hospital for nausea, vomiting, and abdominal pain. We have held your amiodarone as this can cause some nausea and vomiting. Please continue holding this medication, however, you will need to follow up up soon with the a-fib clinic. If you do develop a rapid heart rate, please call either the a-fib clinic or our own office at [phone]. Please continue taking your metoprolol and Eliquis. Please also continue taking your Plendil and Avapro for your blood pressure.

## 2021-11-29 NOTE — Progress Notes (Addendum)
Physical Therapy Treatment/ Discharge Patient Details Name: Kristie Gillespie MRN: [id number] DOB: 1926/07/16 Today's Date: 11/29/2021   History of Present Illness 88 y.o. female admitted 11/26/2021 with persistent nausea and HTN emergency. PMHx: 2/22 admit with new AFib, HTN, HLD, MI, HFpEF.    PT Comments    Pt very pleasant despite being very HOH and utilizing lip reading and written communication. Pt able to walk long hall distance and complete stairs with very minimal assist. Pt lives with daughter and reports she does not walk unassisted at baseline. PT has met acute therapy goals and will sign off with daily ambulation and OOB for meals encouraged.      Recommendations for follow up therapy are one component of a multi-disciplinary discharge planning process, led by the attending physician.  Recommendations may be updated based on patient status, additional functional criteria and insurance authorization.  Follow Up Recommendations  No PT follow up     Assistance Recommended at Discharge PRN  Patient can return home with the following A little help with walking and/or transfers;Help with stairs or ramp for entrance;Assistance with cooking/housework;A little help with bathing/dressing/bathroom;Direct supervision/assist for medications management;Direct supervision/assist for financial management;Assist for transportation   Equipment Recommendations  None recommended by PT    Recommendations for Other Services       Precautions / Restrictions Precautions Precautions: Fall;Other (comment) Precaution Comments: very HOH     Mobility  Bed Mobility Overal bed mobility: Modified Independent             General bed mobility comments: HOB elevated 25 degrees    Transfers Overall transfer level: Modified independent                      Ambulation/Gait Ambulation/Gait assistance: Min assist Gait Distance (Feet): 300 Feet Assistive device: 1 person hand held  assist Gait Pattern/deviations: Step-through pattern, Decreased stride length   Gait velocity interpretation: 1.31 - 2.62 ft/sec, indicative of limited community ambulator   General Gait Details: pt with decreased speed with need for limited assist for stability with UE support   Stairs Stairs: Yes Stairs assistance: Supervision Stair Management: Alternating pattern, Two rails, Forwards Number of Stairs: 11 General stair comments: pt with good stability with use of rails. Pt pauses after every 3 steps   Wheelchair Mobility    Modified Rankin (Stroke Patients Only)       Balance Overall balance assessment: Needs assistance Sitting-balance support: No upper extremity supported, Feet supported Sitting balance-Leahy Scale: Good     Standing balance support: Single extremity supported Standing balance-Leahy Scale: Fair Standing balance comment: static standing without UE support, HHA with gait                            Cognition Arousal/Alertness: Awake/alert Behavior During Therapy: WFL for tasks assessed/performed Overall Cognitive Status: Within Functional Limits for tasks assessed                                          Exercises      General Comments        Pertinent Vitals/Pain Pain Assessment Pain Assessment: No/denies pain    Home Living                          Prior Function  PT Goals (current goals can now be found in the care plan section) Progress towards PT goals: Goals met/education completed, patient discharged from PT    Frequency           PT Plan Current plan remains appropriate    Co-evaluation              AM-PAC PT "6 Clicks" Mobility   Outcome Measure  Help needed turning from your back to your side while in a flat bed without using bedrails?: None Help needed moving from lying on your back to sitting on the side of a flat bed without using bedrails?: None Help  needed moving to and from a bed to a chair (including a wheelchair)?: None Help needed standing up from a chair using your arms (e.g., wheelchair or bedside chair)?: A Little Help needed to walk in hospital room?: A Little Help needed climbing 3-5 steps with a railing? : A Little 6 Click Score: 21    End of Session   Activity Tolerance: Patient tolerated treatment well Patient left: in chair;with call bell/phone within reach Nurse Communication: Mobility status PT Visit Diagnosis: Other abnormalities of gait and mobility (R26.89);Muscle weakness (generalized) (M62.81)     Time: 4356-8616 PT Time Calculation (min) (ACUTE ONLY): 16 min  Charges:  $Gait Training: 8-22 mins                     Bayard Males, PT Acute Rehabilitation Services Pager: [phone] Office: [place] 11/29/2021, 12:48 PM

## 2021-12-03 ENCOUNTER — Encounter (HOSPITAL_COMMUNITY): Payer: Self-pay | Admitting: Physician Assistant

## 2021-12-03 ENCOUNTER — Ambulatory Visit (HOSPITAL_COMMUNITY)
Admit: 2021-12-03 | Discharge: 2021-12-03 | Disposition: A | Discharge status: Home / Self Care | Payer: Federal, State, Local not specified - PPO | Source: Ambulatory Visit | Attending: Physician Assistant | Admitting: Physician Assistant

## 2021-12-03 ENCOUNTER — Other Ambulatory Visit: Payer: Self-pay

## 2021-12-03 VITALS — BP 136/74 | HR 69 | Ht 62.0 in | Wt 142.4 lb

## 2021-12-03 DIAGNOSIS — Z7901 Long term (current) use of anticoagulants: Secondary | ICD-10-CM | POA: Insufficient documentation

## 2021-12-03 DIAGNOSIS — D6869 Other thrombophilia: Secondary | ICD-10-CM | POA: Diagnosis not present

## 2021-12-03 DIAGNOSIS — Z79899 Other long term (current) drug therapy: Secondary | ICD-10-CM | POA: Insufficient documentation

## 2021-12-03 DIAGNOSIS — I252 Old myocardial infarction: Secondary | ICD-10-CM | POA: Insufficient documentation

## 2021-12-03 DIAGNOSIS — N1831 Chronic kidney disease, stage 3a: Secondary | ICD-10-CM | POA: Insufficient documentation

## 2021-12-03 DIAGNOSIS — I129 Hypertensive chronic kidney disease with stage 1 through stage 4 chronic kidney disease, or unspecified chronic kidney disease: Secondary | ICD-10-CM | POA: Insufficient documentation

## 2021-12-03 DIAGNOSIS — I4819 Other persistent atrial fibrillation: Secondary | ICD-10-CM | POA: Diagnosis not present

## 2021-12-03 DIAGNOSIS — I2510 Atherosclerotic heart disease of native coronary artery without angina pectoris: Secondary | ICD-10-CM | POA: Diagnosis not present

## 2021-12-03 DIAGNOSIS — Z85828 Personal history of other malignant neoplasm of skin: Secondary | ICD-10-CM | POA: Insufficient documentation

## 2021-12-03 MED ORDER — METOPROLOL SUCCINATE ER 25 MG PO TB24: 25.0000 mg | ORAL_TABLET | Freq: Every day | ORAL | 3 refills | Status: DC

## 2021-12-03 MED ORDER — APIXABAN 5 MG PO TABS: 5.0000 mg | ORAL_TABLET | Freq: Two times a day (BID) | ORAL | 3 refills | Status: DC

## 2021-12-03 NOTE — Progress Notes (Signed)
? ? ?Primary Care Physician: Pcp, No ?Primary Cardiologist: Dr Radford Pax ?Primary Electrophysiologist: none ?Referring Physician: Dr Radford Pax ? ? ?Kristie Gillespie is a 88 y.o. female with a history of HTN, CKD stage IIIa, skin cancer, HLD, CAD with a history of MI in 2015, atrial fibrillation who presents for consultation in the [facility].  The patient was initially diagnosed with atrial fibrillation 11/24/21 after presenting to her PCP. She was asymptomatic but ECG showed afib with RVR. She was sent to the ED and cardiology consulted. She was started on amiodarone which converted her to SR and Eliquis for a CHADS2VASC score of 5. She returned to the hospital two days after discharge with progressive abdominal discomfort, felt to be related to amiodarone and this was discontinued. Since then, her GI issues have mostly resolved. She remains in SR today. No bleeding issues on anticoagulation.  ? ?Today, she denies symptoms of palpitations, chest pain, shortness of breath, orthopnea, PND, lower extremity edema, dizziness, presyncope, syncope, snoring, daytime somnolence, bleeding, or neurologic sequela. The patient is tolerating medications without difficulties and is otherwise without complaint today.  ? ? ?Atrial Fibrillation Risk Factors: ? ?she does not have symptoms or diagnosis of sleep apnea. ?she does not have a history of rheumatic fever. ? ?she has a BMI of Body mass index is 26.05 kg/m?Marland KitchenMarland Kitchen ?Filed Weights  ? 12/03/21 1101  ?Weight: 64.6 kg  ? ? ?No family history on file. ? ? ?Atrial Fibrillation Management history: ? ?Previous antiarrhythmic drugs: amiodarone ?Previous cardioversions: none ?Previous ablations: none ?CHADS2VASC score: 5 ?Anticoagulation history: Eliquis ? ? ?Past Medical History:  ?Diagnosis Date  ? HLD (hyperlipidemia)   ? HTN (hypertension)   ? MI (myocardial infarction) ([place]) 2015  ? Managed medically  ? Skin cancer   ? ?No past surgical history on file. ? ?Current  Outpatient Medications  ?Medication Sig Dispense Refill  ? acetaminophen (TYLENOL) 650 MG CR tablet Take 650 mg by mouth at bedtime.    ? DM-APAP-CPM (CORICIDIN HBP PO) Take 1 tablet by mouth every morning.    ? felodipine (PLENDIL) 10 MG 24 hr tablet Take 20 mg by mouth every morning.    ? furosemide (LASIX) 20 MG tablet Take 20 mg by mouth every morning.    ? irbesartan (AVAPRO) 300 MG tablet Take 300 mg by mouth daily.    ? KLOR-CON M20 20 MEQ tablet Take 20 mEq by mouth every morning.    ? rosuvastatin (CRESTOR) 5 MG tablet Take 1 tablet (5 mg total) by mouth daily. 30 tablet 0  ? vitamin B-12 (CYANOCOBALAMIN) 1000 MCG tablet Take 1,000 mcg by mouth daily.    ? apixaban (ELIQUIS) 5 MG TABS tablet Take 1 tablet (5 mg total) by mouth 2 (two) times daily. 60 tablet 3  ? metoprolol succinate (TOPROL-XL) 25 MG 24 hr tablet Take 1 tablet (25 mg total) by mouth daily. 30 tablet 3  ? ?No current facility-administered medications for this encounter.  ? ? ?Allergies  ?Allergen Reactions  ? Diltiazem Nausea Only  ? Prednisone Other (See Comments)  ?  Unknown reaction  ? Amoxicillin Rash  ? Tetracyclines & Related Rash  ? ? ?Social History  ? ?Socioeconomic History  ? Marital status: Single  ?  Spouse name: Not on file  ? Number of children: Not on file  ? Years of education: Not on file  ? Highest education level: Not on file  ?Occupational History  ? Not on file  ?Tobacco  Use  ? Smoking status: Never  ? Smokeless tobacco: Never  ?Substance and Sexual Activity  ? Alcohol use: Never  ? Drug use: Never  ? Sexual activity: Not on file  ?Other Topics Concern  ? Not on file  ?Social History Narrative  ? Not on file  ? ?Social Determinants of Health  ? ?Financial Resource Strain: Not on file  ?Food Insecurity: Not on file  ?Transportation Needs: Not on file  ?Physical Activity: Not on file  ?Stress: Not on file  ?Social Connections: Not on file  ?Intimate Partner Violence: Not on file  ? ? ? ?ROS- All systems are reviewed and  negative except as per the HPI above. ? ?Physical Exam: ?Vitals:  ? 12/03/21 1101  ?BP: 136/74  ?Pulse: 69  ?Weight: 64.6 kg  ?Height: 5\' 2"  (1.575 m)  ? ? ?GEN- The patient is a well appearing elderly female, alert and oriented x 3 today.   ?Head- normocephalic, atraumatic ?Eyes-  Sclera clear, conjunctiva pink ?Ears- hearing intact ?Oropharynx- clear ?Neck- supple  ?Lungs- Clear to ausculation bilaterally, normal work of breathing ?Heart- Regular rate and rhythm, no murmurs, rubs or gallops  ?GI- soft, NT, ND, + BS ?Extremities- no clubbing, cyanosis, or edema ?MS- no significant deformity or atrophy ?Skin- no rash or lesion ?Psych- euthymic mood, full affect ?Neuro- strength and sensation are intact ? ?Wt Readings from Last 3 Encounters:  ?12/03/21 64.6 kg  ?11/29/21 65.5 kg  ?11/24/21 67.9 kg  ? ? ?EKG today demonstrates  ?SR ?Vent. rate 69 BPM ?PR interval 148 ms ?QRS duration 76 ms ?QT/QTcB 402/430 ms ? ?Echo 11/24/21 demonstrated  ? 1. Left ventricular ejection fraction, by estimation, is 60 to 65%. The  ?left ventricle has normal function. The left ventricle has no regional  ?wall motion abnormalities. Left ventricular diastolic parameters are  ?consistent with Grade II diastolic dysfunction (pseudonormalization).  ? 2. Right ventricular systolic function is normal. The right ventricular  ?size is normal. There is normal pulmonary artery systolic pressure. The  ?estimated right ventricular systolic pressure is 48.5 mmHg.  ? 3. The mitral valve is normal in structure. Mild mitral valve  ?regurgitation. No evidence of mitral stenosis. Moderate mitral annular  ?calcification.  ? 4. Tricuspid valve regurgitation is mild to moderate.  ? 5. The aortic valve is normal in structure. Aortic valve regurgitation is  ?not visualized. No aortic stenosis is present.  ? 6. The inferior vena cava is normal in size with greater than 50%  ?respiratory variability, suggesting right atrial pressure of 3 mmHg.  ? ?Epic records  are reviewed at length today ? ?CHA2DS2-VASc Score = 5  ?The patient's score is based upon: ?CHF History: 0 ?HTN History: 1 ?Diabetes History: 0 ?Stroke History: 0 ?Vascular Disease History: 1 ?Age Score: 2 ?Gender Score: 1 ?    ? ? ?ASSESSMENT AND PLAN: ?1. Persistent Atrial Fibrillation (ICD10:  I48.19) ?The patient's CHA2DS2-VASc score is 5, indicating a 7.2% annual risk of stroke.   ?General education about afib provided and questions answered. We also discussed her stroke risk and the risks and benefits of anticoagulation. ?Patient intolerant of amiodarone due to GI side effects. ?Continue Eliquis 5 mg BID (weight >60kg, Cr < 1.5) ?Continue Toprol 25 mg daily ? ?2. Secondary Hypercoagulable State (ICD10:  D68.69) ?The patient is at significant risk for stroke/thromboembolism based upon her CHA2DS2-VASc Score of 5.  Continue Apixaban (Eliquis).  ? ?3. CAD ?MI 2015 managed medically ?No anginal symptoms. ? ?4. HTN ?Stable,  no changes today. ? ? ?Follow up with Christen Bame as scheduled.  ? ? ?Ricky Davey Limas PA-C ?[facility] ?[facility] ?[address] ?[place], [place] [address] ?[phone] ?12/03/2021 ?12:35 PM ? ?

## 2022-01-10 NOTE — Progress Notes (Signed)
?Cardiology Office Note:   ? ?Date:  01/17/2022  ? ?ID:  Kristie Gillespie, DOB 23-Mar-1926, MRN [id number] ? ?PCP:  Pcp, No ?  ?[facility] Providers ?Cardiologist:  None    ? ?Referring MD: No ref. provider found  ? ?Chief Complaint: follow-up a fib  ? ?History of Present Illness:   ? ?Kristie Gillespie is a 88 y.o. very pleasant female with a hx of atrial fibrillation, hypertension, CKD, skin cancer, hyperlipidemia, CAD with history of MI in 2015 with no prior stents or CABG, and hard of hearing.  ? ?She presented to her PCP on 11/24/21 with EKG showing a fib with RVR at 158. She was asymptomatic. She was sent to the ED and cardiology was consulted. Started on amiodarone which converted her to NSR and Eliquis for CHADS2VASc score of 5 (age, gender, HTN, vasc dz). Echo revealed LVEF 60-65%, G2 DD, Returned to ED 2 days after discharge with abdominal pain, felt to be related to amiodarone and this was d/c'ed. She was last seen by Adline Peals, PA in [facility] on 12/03/21. She was maintaining sinus rhythm on metoprolol. She did not tolerate diltiazem due to hypotension. Continue on metoprolol succinate 25 mg daily.  ? ?Today, she is here with her daughter.  She reports she is feeling very well.  Has no symptoms with A-fib and continues her regular activities of sewing and crafting.  She walks in the house with the assistance of her rollator and tries to remain active throughout the day. She denies chest pain, shortness of breath, lower extremity edema, fatigue, palpitations, melena, hematuria, hemoptysis, diaphoresis, weakness, presyncope, syncope, orthopnea, and PND. ? ? ?Past Medical History:  ?Diagnosis Date  ? HLD (hyperlipidemia)   ? HTN (hypertension)   ? MI (myocardial infarction) ([place]) 2015  ? Managed medically  ? Skin cancer   ? ? ?History reviewed. No pertinent surgical history. ? ?Current Medications: ?Current Meds  ?Medication Sig  ? acetaminophen (TYLENOL) 650 MG CR tablet Take 650 mg by mouth at  bedtime.  ? DM-APAP-CPM (CORICIDIN HBP PO) Take 1 tablet by mouth every morning.  ? felodipine (PLENDIL) 10 MG 24 hr tablet Take 20 mg by mouth every morning.  ? furosemide (LASIX) 20 MG tablet Take 20 mg by mouth every morning.  ? irbesartan (AVAPRO) 300 MG tablet Take 300 mg by mouth daily.  ? KLOR-CON M20 20 MEQ tablet Take 20 mEq by mouth every morning.  ? metoprolol succinate (TOPROL-XL) 25 MG 24 hr tablet Take 1 tablet (25 mg total) by mouth daily.  ? vitamin B-12 (CYANOCOBALAMIN) 1000 MCG tablet Take 1,000 mcg by mouth daily.  ?  ? ?Allergies:   Diltiazem, Prednisone, Amoxicillin, and Tetracyclines & related  ? ?Social History  ? ?Socioeconomic History  ? Marital status: Single  ?  Spouse name: Not on file  ? Number of children: Not on file  ? Years of education: Not on file  ? Highest education level: Not on file  ?Occupational History  ? Not on file  ?Tobacco Use  ? Smoking status: Never  ? Smokeless tobacco: Never  ?Substance and Sexual Activity  ? Alcohol use: Never  ? Drug use: Never  ? Sexual activity: Not on file  ?Other Topics Concern  ? Not on file  ?Social History Narrative  ? Not on file  ? ?Social Determinants of Health  ? ?Financial Resource Strain: Not on file  ?Food Insecurity: Not on file  ?Transportation Needs: Not on file  ?  Physical Activity: Not on file  ?Stress: Not on file  ?Social Connections: Not on file  ?  ? ?Family History: ?The patient's family history is not on file. ? ?ROS:   ?Please see the history of present illness.  All other systems reviewed and are negative. ? ?Labs/Other Studies Reviewed:   ? ?The following studies were reviewed today: ? ?Echo 11/24/21 ? ?Left Ventricle: Left ventricular ejection fraction, by estimation, is 60  ?to 65%. The left ventricle has normal function. The left ventricle has no  ?regional wall motion abnormalities. The left ventricular internal cavity  ?size was normal in size. There is  no left ventricular hypertrophy. Left ventricular diastolic  parameters  ?are consistent with Grade II diastolic dysfunction (pseudonormalization).  ?Right Ventricle: The right ventricular size is normal. No increase in  ?right ventricular wall thickness. Right ventricular systolic function is  ?normal. There is normal pulmonary artery systolic pressure. The tricuspid  ?regurgitant velocity is 2.23 m/s, and  ? with an assumed right atrial pressure of 3 mmHg, the estimated right  ?ventricular systolic pressure is 03.4 mmHg.  ?Left Atrium: Left atrial size was normal in size.  ?Right Atrium: Right atrial size was normal in size.  ?Pericardium: There is no evidence of pericardial effusion.  ?Mitral Valve: The mitral valve is normal in structure. Moderate mitral  ?annular calcification. Mild mitral valve regurgitation. No evidence of  ?mitral valve stenosis. MV peak gradient, 8.2 mmHg. The mean mitral valve  ?gradient is 3.0 mmHg.  ?Tricuspid Valve: The tricuspid valve is normal in structure. Tricuspid  ?valve regurgitation is mild to moderate. No evidence of tricuspid  ?stenosis.  ?Aortic Valve: The aortic valve is normal in structure. Aortic valve  ?regurgitation is not visualized. No aortic stenosis is present. Aortic  ?valve mean gradient measures 3.0 mmHg. Aortic valve peak gradient measures  ?6.1 mmHg. Aortic valve area, by VTI  ?measures 2.74 cm?Marland Kitchen  ?Pulmonic Valve: The pulmonic valve was normal in structure. Pulmonic valve  ?regurgitation is not visualized. No evidence of pulmonic stenosis.  ?Aorta: The aortic root is normal in size and structure.  ?Venous: The inferior vena cava is normal in size with greater than 50%  ?respiratory variability, suggesting right atrial pressure of 3 mmHg.  ?IAS/Shunts: No atrial level shunt detected by color flow Doppler.  ? ?Recent Labs: ?11/26/2021: TSH 2.611 ?11/27/2021: B Natriuretic Peptide 494.1; Magnesium 2.0 ?11/28/2021: ALT 19; Hemoglobin 13.6; Platelets 180 ?11/29/2021: BUN 22; Creatinine, Ser 0.84; Potassium 3.1; Sodium 136  ?Recent  Lipid Panel ?No results found for: CHOL, TRIG, HDL, CHOLHDL, VLDL, LDLCALC, LDLDIRECT ? ? ?Risk Assessment/Calculations:   ? ?CHA2DS2-VASc Score = 5  ?{This indicates a 7.2% annual risk of stroke. ?The patient's score is based upon: ?CHF History: 0 ?HTN History: 1 ?Diabetes History: 0 ?Stroke History: 0 ?Vascular Disease History: 1 ?Age Score: 2 ?Gender Score: 1 ?  ? ?Physical Exam:   ? ?VS:  BP 120/82   Pulse 80   Ht '5\' 4"'$  (1.626 m)   Wt 145 lb 12.8 oz (66.1 kg)   SpO2 97%   BMI 25.03 kg/m?    ? ?Wt Readings from Last 3 Encounters:  ?01/17/22 145 lb 12.8 oz (66.1 kg)  ?12/03/21 142 lb 6.4 oz (64.6 kg)  ?11/29/21 144 lb 6.4 oz (65.5 kg)  ?  ? ?GEN:  Well nourished, well developed in no acute distress ?HEENT: Normal ?NECK: No JVD; No carotid bruits ?CARDIAC: RRR, no murmurs, rubs, gallops ?RESPIRATORY:  Clear to auscultation without  rales, wheezing or rhonchi  ?ABDOMEN: Soft, non-tender, non-distended ?MUSCULOSKELETAL:  No edema; No deformity. 2+ pedal pulses, equal bilaterally ?SKIN: Warm and dry ?NEUROLOGIC:  Alert and oriented x 3 ?PSYCHIATRIC:  Normal affect  ? ?EKG:  EKG is not ordered today.   ? ?Diagnoses:   ? ?1. Persistent atrial fibrillation ([employer])   ?2. Atrial fibrillation with RVR ([place])   ?3. Hypokalemia   ?4. Essential hypertension   ?5. Coronary artery disease involving native coronary artery of native heart without angina pectoris   ? ?Assessment and Plan:   ? ? ?CAD without angina: Remote history of MI without intervention per family. She denies chest pain, dyspnea, or other symptoms concerning for angina.  No indication for further ischemic evaluation at this time.  ? ?Persistent atrial fibrillation on chronic anticoagulation: Heart rate and rhythm are regular on exam today. Intolerant of amiodarone due to GI symptoms. Asymptomatic with atrial fibrillation, denies palpitations, dyspnea, chest pain currently.  No bleeding concerns on Eliquis.  We will maintain rate control strategy on metoprolol.   Advised patient's daughter to monitor for increase in heart rate and or leg edema, shortness of breath and to notify us with concerns prior to next appointment. ? ?Hypokalemia:  K+ 3.1 on 11/29/21.  She is on oral p

## 2022-01-17 ENCOUNTER — Ambulatory Visit: Payer: Federal, State, Local not specified - PPO | Admitting: Nurse Practitioner

## 2022-01-17 ENCOUNTER — Encounter: Payer: Self-pay | Admitting: Nurse Practitioner

## 2022-01-17 VITALS — BP 120/82 | HR 80 | Ht 64.0 in | Wt 145.8 lb

## 2022-01-17 DIAGNOSIS — I4891 Unspecified atrial fibrillation: Secondary | ICD-10-CM

## 2022-01-17 DIAGNOSIS — I2510 Atherosclerotic heart disease of native coronary artery without angina pectoris: Secondary | ICD-10-CM

## 2022-01-17 DIAGNOSIS — E876 Hypokalemia: Secondary | ICD-10-CM

## 2022-01-17 DIAGNOSIS — I10 Essential (primary) hypertension: Secondary | ICD-10-CM

## 2022-01-17 DIAGNOSIS — I4819 Other persistent atrial fibrillation: Secondary | ICD-10-CM

## 2022-01-17 LAB — BASIC METABOLIC PANEL
BUN/Creatinine Ratio: 16 (ref 12–28)
BUN: 14 mg/dL (ref 10–36)
CO2: 25 mmol/L (ref 20–29)
Calcium: 9.5 mg/dL (ref 8.7–10.3)
Chloride: 104 mmol/L (ref 96–106)
Creatinine, Ser: 0.89 mg/dL (ref 0.57–1.00)
Glucose: 99 mg/dL (ref 70–99)
Potassium: 4.3 mmol/L (ref 3.5–5.2)
Sodium: 146 mmol/L — ABNORMAL HIGH (ref 134–144)
eGFR: 60 mL/min/{1.73_m2} (ref >59)

## 2022-01-17 NOTE — Patient Instructions (Addendum)
Medication Instructions:  ?Your physician recommends that you continue on your current medications as directed. Please refer to the Current Medication list given to you today. ? ?*If you need a refill on your cardiac medications before your next appointment, please call your pharmacy* ? ? ?Lab Work: ?TODAY:  BMET ? ?If you have labs (blood work) drawn today and your tests are completely normal, you will receive your results only by: ?MyChart Message (if you have MyChart) OR ?A paper copy in the mail ?If you have any lab test that is abnormal or we need to change your treatment, we will call you to review the results. ? ? ?Testing/Procedures: ?None ordered ? ? ?Follow-Up: ?At [facility], you and your health needs are our priority.  As part of our continuing mission to provide you with exceptional heart care, we have created designated Provider Care Teams.  These Care Teams include your primary Cardiologist (physician) and Advanced Practice Providers (APPs -  Physician Assistants and Nurse Practitioners) who all work together to provide you with the care you need, when you need it. ? ?We recommend signing up for the patient portal called "MyChart".  Sign up information is provided on this After Visit Summary.  MyChart is used to connect with patients for Virtual Visits (Telemedicine).  Patients are able to view lab/test results, encounter notes, upcoming appointments, etc.  Non-urgent messages can be sent to your provider as well.   ?To learn more about what you can do with MyChart, go to [email or web address].   ? ?Your next appointment:   ?6 month(s) ? ?The format for your next appointment:   ?In Person ? ?Provider:   ?None  or Christen Bame, NP       ? ? ?Other Instructions ? ? ?Important Information About Sugar ? ? ? ? ? ?

## 2022-01-18 ENCOUNTER — Other Ambulatory Visit (HOSPITAL_COMMUNITY): Payer: Self-pay

## 2022-03-20 ENCOUNTER — Other Ambulatory Visit (HOSPITAL_COMMUNITY): Payer: Self-pay | Admitting: Physician Assistant

## 2022-03-20 DIAGNOSIS — I4891 Unspecified atrial fibrillation: Secondary | ICD-10-CM

## 2022-03-21 NOTE — Telephone Encounter (Signed)
Eliquis '5mg'$  refill request received. Patient is 88 years old, weight-66.1kg, Crea-0.89 on 01/17/2022, Diagnosis-Afib, and last seen by Christen Bame, NP on 01/17/2022. Dose is appropriate based on dosing criteria. Will send in refill to requested pharmacy.

## 2022-05-05 ENCOUNTER — Other Ambulatory Visit: Payer: Self-pay

## 2022-05-05 ENCOUNTER — Emergency Department (HOSPITAL_COMMUNITY)
Admission: EM | Admit: 2022-05-05 | Discharge: 2022-05-05 | Discharge status: Against Medical Advice | Payer: Federal, State, Local not specified - PPO | Attending: Emergency Medicine | Admitting: Emergency Medicine

## 2022-05-05 ENCOUNTER — Encounter (HOSPITAL_COMMUNITY): Payer: Self-pay

## 2022-05-05 DIAGNOSIS — R55 Syncope and collapse: Secondary | ICD-10-CM | POA: Diagnosis not present

## 2022-05-05 DIAGNOSIS — R42 Dizziness and giddiness: Secondary | ICD-10-CM | POA: Diagnosis not present

## 2022-05-05 DIAGNOSIS — Z5321 Procedure and treatment not carried out due to patient leaving prior to being seen by health care provider: Secondary | ICD-10-CM | POA: Diagnosis not present

## 2022-05-05 LAB — CBC WITH DIFFERENTIAL/PLATELET
Abs Immature Granulocytes: 0.01 10*3/uL (ref 0.00–0.07)
Basophils Absolute: 0 10*3/uL (ref 0.0–0.1)
Basophils Relative: 0 %
Eosinophils Absolute: 0 10*3/uL (ref 0.0–0.5)
Eosinophils Relative: 1 %
HCT: 45.6 % (ref 36.0–46.0)
Hemoglobin: 14 g/dL (ref 12.0–15.0)
Immature Granulocytes: 0 %
Lymphocytes Relative: 39 %
Lymphs Abs: 2.4 10*3/uL (ref 0.7–4.0)
MCH: 33.4 pg (ref 26.0–34.0)
MCHC: 30.7 g/dL (ref 30.0–36.0)
MCV: 108.8 fL — ABNORMAL HIGH (ref 80.0–100.0)
Monocytes Absolute: 0.7 10*3/uL (ref 0.1–1.0)
Monocytes Relative: 11 %
Neutro Abs: 3 10*3/uL (ref 1.7–7.7)
Neutrophils Relative %: 49 %
Platelets: 160 10*3/uL (ref 150–400)
RBC: 4.19 MIL/uL (ref 3.87–5.11)
RDW: 12.9 % (ref 11.5–15.5)
WBC: 6.2 10*3/uL (ref 4.0–10.5)
nRBC: 0 % (ref 0.0–0.2)

## 2022-05-05 LAB — COMPREHENSIVE METABOLIC PANEL
ALT: 15 U/L (ref 0–44)
AST: 22 U/L (ref 15–41)
Albumin: 3.9 g/dL (ref 3.5–5.0)
Alkaline Phosphatase: 89 U/L (ref 38–126)
Anion gap: 9 (ref 5–15)
BUN: 20 mg/dL (ref 8–23)
CO2: 27 mmol/L (ref 22–32)
Calcium: 9.6 mg/dL (ref 8.9–10.3)
Chloride: 105 mmol/L (ref 98–111)
Creatinine, Ser: 1.03 mg/dL — ABNORMAL HIGH (ref 0.44–1.00)
GFR, Estimated: 50 mL/min — ABNORMAL LOW (ref >60)
Glucose, Bld: 84 mg/dL (ref 70–99)
Potassium: 4 mmol/L (ref 3.5–5.1)
Sodium: 141 mmol/L (ref 135–145)
Total Bilirubin: 1.2 mg/dL (ref 0.3–1.2)
Total Protein: 6.4 g/dL — ABNORMAL LOW (ref 6.5–8.1)

## 2022-05-05 LAB — CBG MONITORING, ED: Glucose-Capillary: 98 mg/dL (ref 70–99)

## 2022-05-05 NOTE — ED Notes (Signed)
PATIENT LEFT WITHOUT BEEN SEEN BY [place]

## 2022-05-05 NOTE — ED Provider Triage Note (Signed)
Emergency Medicine Provider Triage Evaluation Note  Kristie Gillespie , a 88 y.o. female  was evaluated in triage.  Pt complains of presyncopal type episode.  Patient is companied by daughter who is primary historian given significant hearing deficits of patient.  Daughter states that patient was sat down earlier this morning when she stood up to go get her pillbox when she felt dizzy and lightheaded prompting her to sit back down in her chair.  She noted "seeing spots."  She denies ever loss of consciousness or trauma to any part of her body.  Her daughter took the patient's blood pressure and noted to be 50 systolic.  The blood pressure was taken using a wrist cuff.  She states she "felt bad" for approximately 15 to 20 minutes before she returned to baseline.  She currently is endorsing no complaints.  Review of Systems  Positive: See above Negative:   Physical Exam  BP 97/60 (BP Location: Left Arm)   Pulse (!) 59   Temp 97.7 F (36.5 C) (Oral)   Resp 12   SpO2 97%  Gen:   Awake, no distress   Resp:  Normal effort  MSK:   Moves extremities without difficulty  Other:  Regular rate and rhythm.  Lungs clear to auscultation.  Medical Decision Making  Medically screening exam initiated at 9:43 AM.  Appropriate orders placed.  Kristie Gillespie was informed that the remainder of the evaluation will be completed by another provider, this initial triage assessment does not replace that evaluation, and the importance of remaining in the ED until their evaluation is complete.     Wilnette Kales, [place] 05/05/22 [phone]

## 2022-05-05 NOTE — ED Triage Notes (Signed)
Pt accompanied by daughter who reports pt stood up to take her morning meds this morning and immediately sat back down, near syncopal. Daughter checked her BP right after and said it was reading really low (50s/30s) daughter called her PCP and they told her to come here. Pt arrives to ED alert, HOH, denies any complaints at this time.

## 2022-10-20 ENCOUNTER — Other Ambulatory Visit (HOSPITAL_COMMUNITY): Payer: Self-pay | Admitting: Nurse Practitioner

## 2022-10-20 DIAGNOSIS — I4891 Unspecified atrial fibrillation: Secondary | ICD-10-CM

## 2022-10-20 NOTE — Telephone Encounter (Signed)
Prescription refill request for Eliquis received. Indication: Afib  Last office visit:01/17/22 ([place])  Scr: 0.85 (05/26/22)  Age: 89 Weight: 66.1kg  Appropriate dose and refill sent to requested pharmacy

## 2023-02-08 ENCOUNTER — Other Ambulatory Visit: Payer: Self-pay

## 2023-02-08 ENCOUNTER — Emergency Department (HOSPITAL_COMMUNITY): Payer: Federal, State, Local not specified - PPO

## 2023-02-08 ENCOUNTER — Emergency Department (HOSPITAL_COMMUNITY)
Admission: EM | Admit: 2023-02-08 | Discharge: 2023-02-08 | Disposition: A | Discharge status: Home / Self Care | Payer: Federal, State, Local not specified - PPO | Attending: Emergency Medicine | Admitting: Emergency Medicine

## 2023-02-08 DIAGNOSIS — L03116 Cellulitis of left lower limb: Secondary | ICD-10-CM | POA: Diagnosis not present

## 2023-02-08 DIAGNOSIS — M25572 Pain in left ankle and joints of left foot: Secondary | ICD-10-CM | POA: Diagnosis present

## 2023-02-08 DIAGNOSIS — R600 Localized edema: Secondary | ICD-10-CM | POA: Diagnosis not present

## 2023-02-08 DIAGNOSIS — Z7901 Long term (current) use of anticoagulants: Secondary | ICD-10-CM | POA: Insufficient documentation

## 2023-02-08 MED ORDER — SULFAMETHOXAZOLE-TRIMETHOPRIM 800-160 MG PO TABS: 1.0000 | ORAL_TABLET | Freq: Two times a day (BID) | ORAL | 0 refills | Status: AC

## 2023-02-08 MED ORDER — SULFAMETHOXAZOLE-TRIMETHOPRIM 800-160 MG PO TABS
1.0000 | ORAL_TABLET | Freq: Once | ORAL | Status: AC
  Administered 2023-02-08: 1 via ORAL
  Filled 2023-02-08: qty 1

## 2023-02-08 NOTE — ED Triage Notes (Signed)
Pt bib family with concern for infection to LLE for an unknown length of time. Pt lower extremity with redness and warm to the touch.

## 2023-02-08 NOTE — ED Notes (Signed)
Dischartged prior to primary nurse able to do an assessment

## 2023-02-08 NOTE — Discharge Instructions (Signed)
Please call your family doctor to have a follow-up within 48 hours to make sure that this area is improving with less redness less swelling less tenderness.  If you develop increasing pain swelling fever spreading redness or any worsening symptoms return to the emergency department immediately.  Please take Bactrim antibiotic twice a day for the next 7 days.  Thank you for allowing [place] to treat you in the emergency department today.  After reviewing your examination and potential testing that was done it appears that you are safe to go home.  I would like for you to follow-up with your doctor within the next several days, have them obtain your results and follow-up with them to review all of these tests.  If you should develop severe or worsening symptoms return to the emergency department immediately

## 2023-02-08 NOTE — ED Provider Notes (Signed)
[place] EMERGENCY DEPARTMENT AT [facility] Provider Note   CSN: [id number] Arrival date & time: 02/08/23  1021     History  No chief complaint on file.   Kristie Gillespie is a 89 y.o. female.  HPI   This patient is a 89 year old female, currently on Eliquis because of atrial fibrillation, she is highly functional, accompanied by a family member, the patient is able to get off of the chair and walk to the bed without difficulty, she is in good spirits but complains of some increasing redness and pain around the left ankle.  She has had some varicose veins and has started to have some redness and warmth around these on the dorsal aspect of the left ankle.  No fevers or chills, no numbness or weakness, minimal swelling around the calf on the left side.  Home Medications Prior to Admission medications   Medication Sig Start Date End Date Taking? Authorizing Provider  sulfamethoxazole-trimethoprim (BACTRIM DS) 800-160 MG tablet Take 1 tablet by mouth 2 (two) times daily for 7 days. 02/08/23 02/15/23 Yes Eber Hong, MD  acetaminophen (TYLENOL) 650 MG CR tablet Take 650 mg by mouth at bedtime.    [provider]  DM-APAP-CPM (CORICIDIN HBP PO) Take 1 tablet by mouth every morning.    [provider]  ELIQUIS 5 MG TABS tablet TAKE 1 TABLET BY MOUTH TWICE A DAY 10/20/22   Quintella Reichert, MD  felodipine (PLENDIL) 10 MG 24 hr tablet Take 20 mg by mouth every morning. 11/16/21   [provider]  furosemide (LASIX) 20 MG tablet Take 20 mg by mouth every morning. 09/20/21   [provider]  irbesartan (AVAPRO) 300 MG tablet Take 300 mg by mouth daily. 09/17/21   [provider]  KLOR-CON M20 20 MEQ tablet Take 20 mEq by mouth every morning. 11/21/21   [provider]  metoprolol succinate (TOPROL-XL) 25 MG 24 hr tablet Take 1 tablet (25 mg total) by mouth daily. 12/03/21 01/17/22  Fenton, Clint R, PA  vitamin B-12 (CYANOCOBALAMIN) 1000  MCG tablet Take 1,000 mcg by mouth daily.    [provider]      Allergies    Diltiazem, Prednisone, Amoxicillin, and Tetracyclines & related    Review of Systems   Review of Systems  Constitutional:  Negative for fever.  Skin:  Positive for rash.    Physical Exam Updated Vital Signs BP (!) 159/72 (BP Location: Right Arm)   Pulse 77   Temp 97.6 F (36.4 C) (Oral)   Resp 16   SpO2 100%  Physical Exam Vitals and nursing note reviewed.  Constitutional:      Appearance: She is well-developed. She is not diaphoretic.  HENT:     Head: Normocephalic and atraumatic.  Eyes:     General:        Right eye: No discharge.        Left eye: No discharge.     Conjunctiva/sclera: Conjunctivae normal.  Pulmonary:     Effort: Pulmonary effort is normal. No respiratory distress.  Musculoskeletal:        General: Tenderness present.     Right lower leg: No edema.     Left lower leg: Edema present.  Skin:    General: Skin is warm and dry.     Findings: Rash present. No erythema.     Comments: Mild erythema surrounding the anterior aspect or dorsal aspect of the left ankle, is not circumferential, there is  normal pulses at the foot, no induration or fluctuance  Neurological:     Mental Status: She is alert.     Coordination: Coordination normal.     ED Results / Procedures / Treatments   Labs (all labs ordered are listed, but only abnormal results are displayed) Labs Reviewed  COMPREHENSIVE METABOLIC PANEL  CBC WITH DIFFERENTIAL/PLATELET    EKG None  Radiology No results found.  Procedures Procedures    Medications Ordered in ED Medications  sulfamethoxazole-trimethoprim (BACTRIM DS) 800-160 MG per tablet 1 tablet (has no administration in time range)    ED Course/ Medical Decision Making/ A&P                             Medical Decision Making Amount and/or Complexity of Data Reviewed Labs: ordered. Radiology: ordered.  Risk Prescription drug  management.   Patient's vital signs reviewed, no fever or tachycardia, minimal swelling around this area consistent with a early cellulitis.  The patient is anticoagulated on Eliquis making DVT extremely unlikely.  She has no chest pain or shortness of breath and is otherwise well-appearing, she and the family member are both completely agreeable to antibiotics without significant workup or hospitalization at this time.  Very stable for discharge        Final Clinical Impression(s) / ED Diagnoses Final diagnoses:  Cellulitis of left ankle    Rx / DC Orders ED Discharge Orders          Ordered    sulfamethoxazole-trimethoprim (BACTRIM DS) 800-160 MG tablet  2 times daily        02/08/23 1044              Eber Hong, MD 02/08/23 1047

## 2023-02-22 ENCOUNTER — Other Ambulatory Visit (HOSPITAL_COMMUNITY): Payer: Self-pay | Admitting: Physician Assistant

## 2023-03-18 ENCOUNTER — Other Ambulatory Visit (HOSPITAL_COMMUNITY): Payer: Self-pay | Admitting: Cardiology

## 2023-03-29 ENCOUNTER — Other Ambulatory Visit (HOSPITAL_COMMUNITY): Payer: Self-pay | Admitting: Nurse Practitioner

## 2023-04-27 ENCOUNTER — Other Ambulatory Visit (HOSPITAL_COMMUNITY): Payer: Self-pay | Admitting: Nurse Practitioner

## 2023-04-29 ENCOUNTER — Other Ambulatory Visit (HOSPITAL_COMMUNITY): Payer: Self-pay | Admitting: Cardiology

## 2023-04-29 DIAGNOSIS — I4891 Unspecified atrial fibrillation: Secondary | ICD-10-CM

## 2023-05-01 NOTE — Telephone Encounter (Signed)
Prescription refill request for Eliquis received. Indication:afib Last office visit:needs appt Scr:0.85  8/23 Age: 89 Weight:66.1  kg  Prescription refilled

## 2023-05-06 ENCOUNTER — Other Ambulatory Visit (HOSPITAL_COMMUNITY): Payer: Self-pay | Admitting: Nurse Practitioner

## 2023-05-21 ENCOUNTER — Other Ambulatory Visit (HOSPITAL_COMMUNITY): Payer: Self-pay | Admitting: Cardiology

## 2023-05-21 DIAGNOSIS — I4891 Unspecified atrial fibrillation: Secondary | ICD-10-CM

## 2023-05-22 NOTE — Telephone Encounter (Signed)
 Prescription refill request for Eliquis received. Indication:afib Last office visit:needs appt Scr:0.85  8/23 Age: 89 Weight:66.1  kg  Prescription refilled

## 2023-06-13 ENCOUNTER — Other Ambulatory Visit (HOSPITAL_COMMUNITY): Payer: Self-pay | Admitting: Cardiology

## 2023-06-13 DIAGNOSIS — I4891 Unspecified atrial fibrillation: Secondary | ICD-10-CM

## 2023-06-13 NOTE — Telephone Encounter (Signed)
Prescription refill request for Eliquis received. Indication: AF Last office visit: 01/17/22 Benjamine Sprague NP Scr: 0.85 on 05/26/22 Age: 89 Weight: 66.1kg  Based on above findings Eliquis 5mg  twice daily is the appropriate dose.  Pt is past due for MD appt.  Message sent to schedulers to make appt.  Requested CBC /BMP be done at time of office visit.

## 2023-06-22 ENCOUNTER — Other Ambulatory Visit: Payer: Self-pay

## 2023-06-22 ENCOUNTER — Emergency Department (HOSPITAL_COMMUNITY)
Admission: EM | Admit: 2023-06-22 | Discharge: 2023-06-22 | Disposition: A | Discharge status: Home / Self Care | Payer: Federal, State, Local not specified - PPO | Attending: Student | Admitting: Student

## 2023-06-22 ENCOUNTER — Encounter (HOSPITAL_COMMUNITY): Payer: Self-pay

## 2023-06-22 ENCOUNTER — Emergency Department (HOSPITAL_COMMUNITY): Payer: Federal, State, Local not specified - PPO

## 2023-06-22 DIAGNOSIS — Z85828 Personal history of other malignant neoplasm of skin: Secondary | ICD-10-CM | POA: Insufficient documentation

## 2023-06-22 DIAGNOSIS — N189 Chronic kidney disease, unspecified: Secondary | ICD-10-CM | POA: Insufficient documentation

## 2023-06-22 DIAGNOSIS — M542 Cervicalgia: Secondary | ICD-10-CM | POA: Insufficient documentation

## 2023-06-22 DIAGNOSIS — Z7901 Long term (current) use of anticoagulants: Secondary | ICD-10-CM | POA: Insufficient documentation

## 2023-06-22 DIAGNOSIS — Z79899 Other long term (current) drug therapy: Secondary | ICD-10-CM | POA: Diagnosis not present

## 2023-06-22 DIAGNOSIS — M436 Torticollis: Secondary | ICD-10-CM | POA: Insufficient documentation

## 2023-06-22 DIAGNOSIS — Z7984 Long term (current) use of oral hypoglycemic drugs: Secondary | ICD-10-CM | POA: Diagnosis not present

## 2023-06-22 DIAGNOSIS — I129 Hypertensive chronic kidney disease with stage 1 through stage 4 chronic kidney disease, or unspecified chronic kidney disease: Secondary | ICD-10-CM | POA: Diagnosis not present

## 2023-06-22 DIAGNOSIS — I2510 Atherosclerotic heart disease of native coronary artery without angina pectoris: Secondary | ICD-10-CM | POA: Diagnosis not present

## 2023-06-22 MED ORDER — DIPHENHYDRAMINE HCL 50 MG/ML IJ SOLN
25.0000 mg | Freq: Once | INTRAMUSCULAR | Status: AC
Start: 2023-06-22 — End: 2023-06-22
  Administered 2023-06-22: 25 mg via INTRAVENOUS
  Filled 2023-06-22: qty 1

## 2023-06-22 MED ORDER — DIAZEPAM 5 MG PO TABS
5.0000 mg | ORAL_TABLET | Freq: Once | ORAL | Status: AC
Start: 2023-06-22 — End: 2023-06-22
  Administered 2023-06-22: 5 mg via ORAL
  Filled 2023-06-22: qty 1

## 2023-06-22 MED ORDER — LACTATED RINGERS IV BOLUS
1000.0000 mL | Freq: Once | INTRAVENOUS | Status: AC
  Administered 2023-06-22: 1000 mL via INTRAVENOUS

## 2023-06-22 MED ORDER — DEXAMETHASONE SODIUM PHOSPHATE 10 MG/ML IJ SOLN
8.0000 mg | Freq: Once | INTRAMUSCULAR | Status: AC
  Administered 2023-06-22: 8 mg via INTRAVENOUS
  Filled 2023-06-22: qty 1

## 2023-06-22 MED ORDER — LIDOCAINE 5 % EX PTCH
1.0000 | MEDICATED_PATCH | CUTANEOUS | Status: DC
Start: 2023-06-22 — End: 2023-06-22
  Administered 2023-06-22: 1 via TRANSDERMAL
  Filled 2023-06-22: qty 1

## 2023-06-22 MED ORDER — METHYLPREDNISOLONE 4 MG PO TBPK: ORAL_TABLET | ORAL | 0 refills | Status: DC

## 2023-06-22 MED ORDER — PROCHLORPERAZINE EDISYLATE 10 MG/2ML IJ SOLN
10.0000 mg | Freq: Once | INTRAMUSCULAR | Status: AC
  Administered 2023-06-22: 10 mg via INTRAVENOUS
  Filled 2023-06-22: qty 2

## 2023-06-22 MED ORDER — HYDROCODONE-ACETAMINOPHEN 5-325 MG PO TABS
1.0000 | ORAL_TABLET | Freq: Once | ORAL | Status: AC
  Administered 2023-06-22: 1 via ORAL
  Filled 2023-06-22: qty 1

## 2023-06-22 NOTE — ED Notes (Signed)
Pt placed on 2L [place] due to O2 dropping after valium. EDP aware

## 2023-06-22 NOTE — Discharge Instructions (Signed)
Please return if symptoms worsen.  Follow-up with your primary care doctor.  Take steroids as prescribed.  We do think that this could be nerve irritation either due to a facial nerve or may be a nerve from your neck.  Recommend close follow-up with her primary care doctor in the next few days to see how you are responding to anti-inflammatory meds.  Take your Medrol Dosepak next dose tomorrow.

## 2023-06-22 NOTE — ED Triage Notes (Signed)
Patient is HOH has lidoderm patient in place complaining of severe neck pain.

## 2023-06-22 NOTE — ED Provider Notes (Addendum)
Patient here with neck pain, facial pain.  CT of the necks unremarkable.  Is been treated with some anti-inflammatory and pain meds with great improvement.  Differential could be degenerative issue/radicular type discomfort versus may be trigeminal neuralgia.  Will put her on Medrol Dosepak and have her follow-up with her primary care doctor for reevaluation and further assessment.  At this time we have no concern for stroke or other acute process.  She is neurologically intact.  She is feeling much better.  Discharged in good condition.  Despite medical chart say maybe low allergy to prednisone family does not state any issues with steroids.  She is tolerated Decadron here well.  This chart was dictated using voice recognition software.  Despite best efforts to proofread,  errors can occur which can change the documentation meaning.    Kristie Norfolk, DO 06/22/23 1541    Kristie Norfolk, DO 06/22/23 1644

## 2023-06-22 NOTE — ED Notes (Signed)
EDP at bedside  

## 2023-06-22 NOTE — ED Provider Notes (Signed)
[place] EMERGENCY DEPARTMENT AT [facility] Provider Note  CSN: [id number] Arrival date & time: 06/22/23 1006  Chief Complaint(s) No chief complaint on file.  HPI Kristie Gillespie is a 89 y.o. female with PMH HTN, HLD, CAD status post MI, permanent A-fib on Eliquis, CKD who presents emergency room for evaluation of atraumatic neck pain.  Symptoms started yesterday but have progressively worsened.  Pain worse in the left neck.  Primarily starting in the ear and radiating down to the jaw and neck.  Difficulty turning her head side-to-side.  Denies numbness, tingling, weakness or other neurologic complaints.  Denies chest pain, shortness of breath, abdominal pain, nausea, vomiting or other systemic symptoms.   Past Medical History Past Medical History:  Diagnosis Date   HLD (hyperlipidemia)    HTN (hypertension)    MI (myocardial infarction) (HCC) 2015   Managed medically   Skin cancer    Patient Active Problem List   Diagnosis Date Noted   Secondary hypercoagulable state (HCC) 12/03/2021   Hypertensive urgency 11/27/2021   Nausea without vomiting 11/27/2021   A-fib (HCC) 11/24/2021   Persistent atrial fibrillation (HCC) 11/24/2021   Atrial fibrillation with RVR (HCC)    Elevated troponin    Primary hypertension    Home Medication(s) Prior to Admission medications   Medication Sig Start Date End Date Taking? Authorizing Provider  acetaminophen (TYLENOL) 650 MG CR tablet Take 650 mg by mouth at bedtime.    [provider]  DM-APAP-CPM (CORICIDIN HBP PO) Take 1 tablet by mouth every morning.    [provider]  ELIQUIS 5 MG TABS tablet TAKE 1 TABLET BY MOUTH 2 TIMES DAILY. NEEDS CARDIOLOGY APPT FOR ELIQUIS REFILLS, CALL OFFICE 06/13/23   Quintella Reichert, MD  felodipine (PLENDIL) 10 MG 24 hr tablet Take 20 mg by mouth every morning. 11/16/21   [provider]  furosemide (LASIX) 20 MG tablet Take 20 mg by mouth every morning. 09/20/21    [provider]  irbesartan (AVAPRO) 300 MG tablet Take 300 mg by mouth daily. 09/17/21   [provider]  KLOR-CON M20 20 MEQ tablet Take 20 mEq by mouth every morning. 11/21/21   [provider]  metoprolol succinate (TOPROL-XL) 25 MG 24 hr tablet Take 1 tablet (25 mg total) by mouth daily. Please call office to schedule an appt for further refills. Thank you 04/27/23   Swinyer, Zachary George, NP  vitamin B-12 (CYANOCOBALAMIN) 1000 MCG tablet Take 1,000 mcg by mouth daily.    [provider]                                                                                                                                    Past Surgical History No past surgical history on file. Family History No family history on file.  Social History Social History   Tobacco Use   Smoking status: Never   Smokeless tobacco:  Never  Substance Use Topics   Alcohol use: Never   Drug use: Never   Allergies Diltiazem, Prednisone, Amoxicillin, and Tetracyclines & related  Review of Systems Review of Systems  Musculoskeletal:  Positive for neck pain.    Physical Exam Vital Signs  I have reviewed the triage vital signs There were no vitals taken for this visit.  Physical Exam Vitals and nursing note reviewed.  Constitutional:      General: She is not in acute distress.    Appearance: She is well-developed.  HENT:     Head: Normocephalic and atraumatic.  Eyes:     Conjunctiva/sclera: Conjunctivae normal.  Cardiovascular:     Rate and Rhythm: Normal rate and regular rhythm.     Heart sounds: No murmur heard. Pulmonary:     Effort: Pulmonary effort is normal. No respiratory distress.     Breath sounds: Normal breath sounds.  Abdominal:     Palpations: Abdomen is soft.     Tenderness: There is no abdominal tenderness.  Musculoskeletal:        General: No swelling.     Cervical back: Neck supple. Tenderness present.  Skin:    General: Skin is warm and dry.      Capillary Refill: Capillary refill takes less than 2 seconds.  Neurological:     Mental Status: She is alert.  Psychiatric:        Mood and Affect: Mood normal.     ED Results and Treatments Labs (all labs ordered are listed, but only abnormal results are displayed) Labs Reviewed - No data to display                                                                                                                        Radiology No results found.  Pertinent labs & imaging results that were available during my care of the patient were reviewed by me and considered in my medical decision making (see MDM for details).  Medications Ordered in ED Medications  lidocaine (LIDODERM) 5 % 1 patch (has no administration in time range)  diazepam (VALIUM) tablet 5 mg (has no administration in time range)                                                                                                                                     Procedures Procedures  (including critical care time)  Medical Decision  Making / ED Course   This patient presents to the ED for concern of neck pain, jaw pain, this involves an extensive number of treatment options, and is a complaint that carries with it a high risk of complications and morbidity.  The differential diagnosis includes trigeminal neuralgia, torticollis, musculoskeletal strain, vertebral fracture  MDM: Patient seen emergency room for evaluation of neck pain.  Physical exam with tenderness over the sternocleidomastoid on the left but is otherwise unremarkable.  No focal motor or sensory deficits.  CT C-spine unremarkable.  Lidocaine patch and Norco without symptomatic improvement.  Patient presentation appears to be consistent with trigeminal neuralgia and cocktail given with Decadron, Compazine, Benadryl and fluids.  Patient pending reevaluation by oncoming provider at time of signout.  Please see provider signout continuation of  workup.   Additional history obtained: -Additional history obtained from daughter -External records from outside source obtained and reviewed including: Chart review including previous notes, labs, imaging, consultation notes   Imaging Studies ordered: I ordered imaging studies including CT C-spine I independently visualized and interpreted imaging. I agree with the radiologist interpretation   Medicines ordered and prescription drug management: Meds ordered this encounter  Medications   lidocaine (LIDODERM) 5 % 1 patch   diazepam (VALIUM) tablet 5 mg    -I have reviewed the patients home medicines and have made adjustments as needed  Critical interventions none   Cardiac Monitoring: The patient was maintained on a cardiac monitor.  I personally viewed and interpreted the cardiac monitored which showed an underlying rhythm of: NSR  Social Determinants of Health:  Factors impacting patients care include: none   Reevaluation: After the interventions noted above, I reevaluated the patient and found that they have :stayed the same  Co morbidities that complicate the patient evaluation  Past Medical History:  Diagnosis Date   HLD (hyperlipidemia)    HTN (hypertension)    MI (myocardial infarction) (HCC) 2015   Managed medically   Skin cancer       Dispostion: I considered admission for this patient, and disposition pending reevaluation by oncoming provider.  Please see provider signout continuation of workup.     Final Clinical Impression(s) / ED Diagnoses Final diagnoses:  None     @PCDICTATION @    Glendora Score, MD 06/22/23 [phone]

## 2023-07-01 ENCOUNTER — Other Ambulatory Visit (HOSPITAL_COMMUNITY): Payer: Self-pay | Admitting: Nurse Practitioner

## 2023-07-01 ENCOUNTER — Other Ambulatory Visit (HOSPITAL_COMMUNITY): Payer: Self-pay | Admitting: Cardiology

## 2023-07-01 DIAGNOSIS — I4891 Unspecified atrial fibrillation: Secondary | ICD-10-CM

## 2023-07-03 NOTE — Telephone Encounter (Signed)
Prescription refill request for Eliquis received. Indication:afib Last office visit:needs appt Scr:1.29  9/24 Age: 89 Weight:59  kg  Prescription refilled

## 2023-07-07 ENCOUNTER — Emergency Department (HOSPITAL_COMMUNITY)
Admission: EM | Admit: 2023-07-07 | Discharge: 2023-07-07 | Disposition: A | Discharge status: Home / Self Care | Payer: Federal, State, Local not specified - PPO | Attending: Emergency Medicine | Admitting: Emergency Medicine

## 2023-07-07 ENCOUNTER — Other Ambulatory Visit: Payer: Self-pay

## 2023-07-07 ENCOUNTER — Encounter (HOSPITAL_COMMUNITY): Payer: Self-pay

## 2023-07-07 DIAGNOSIS — G8929 Other chronic pain: Secondary | ICD-10-CM | POA: Insufficient documentation

## 2023-07-07 DIAGNOSIS — M542 Cervicalgia: Secondary | ICD-10-CM | POA: Diagnosis present

## 2023-07-07 DIAGNOSIS — Z7901 Long term (current) use of anticoagulants: Secondary | ICD-10-CM | POA: Insufficient documentation

## 2023-07-07 DIAGNOSIS — M62838 Other muscle spasm: Secondary | ICD-10-CM | POA: Insufficient documentation

## 2023-07-07 MED ORDER — PREDNISONE 10 MG PO TABS: ORAL_TABLET | ORAL | 0 refills | Status: DC

## 2023-07-07 MED ORDER — PREDNISONE 20 MG PO TABS
20.0000 mg | ORAL_TABLET | Freq: Once | ORAL | Status: AC
  Administered 2023-07-07: 20 mg via ORAL
  Filled 2023-07-07: qty 1

## 2023-07-07 MED ORDER — METHOCARBAMOL 500 MG PO TABS
500.0000 mg | ORAL_TABLET | Freq: Once | ORAL | Status: AC
  Administered 2023-07-07: 500 mg via ORAL
  Filled 2023-07-07: qty 1

## 2023-07-07 MED ORDER — HYDROCODONE-ACETAMINOPHEN 5-325 MG PO TABS
1.0000 | ORAL_TABLET | Freq: Once | ORAL | Status: AC
  Administered 2023-07-07: 1 via ORAL
  Filled 2023-07-07: qty 1

## 2023-07-07 MED ORDER — LIDOCAINE 5 % EX PTCH
1.0000 | MEDICATED_PATCH | CUTANEOUS | Status: DC
  Administered 2023-07-07: 1 via TRANSDERMAL
  Filled 2023-07-07: qty 1

## 2023-07-07 MED ORDER — METHOCARBAMOL 500 MG PO TABS: 500.0000 mg | ORAL_TABLET | Freq: Three times a day (TID) | ORAL | 0 refills | Status: DC | PRN

## 2023-07-07 NOTE — ED Triage Notes (Addendum)
Patient is HOH and has chronic neck pain and ran out of steroids and needs her meds.  Reports daughter couldn't get in touch with her doctor to get another prescription. Patient is deaf and reports she was on a steroid pack and had started to feel better but now that she is not on steroids anymore pain has returned.  Reports when she was here last they told her she had a pinched nerve.

## 2023-07-07 NOTE — ED Provider Notes (Signed)
[place] EMERGENCY DEPARTMENT AT [facility] Provider Note   CSN: [id number] Arrival date & time: 07/07/23  1349     History  Chief Complaint  Patient presents with   Neck Pain    Kristie Gillespie is a 89 y.o. female.  Pt with hx neck pain ?ddd, c/o pain having improved when recently on prednisone but now pain persists as out of prednisone. Denies new pain or injury. No trauma or fall. No fever or chills. No arm numbness/weakness. No chest pain or sob. Neck pain posteriorly and laterally. Constant. Worse w certain movements of neck.   The history is provided by the patient, a relative and medical records.  Neck Pain Associated symptoms: no chest pain, no fever, no headaches, no numbness and no weakness        Home Medications Prior to Admission medications   Medication Sig Start Date End Date Taking? Authorizing Provider  acetaminophen (TYLENOL) 650 MG CR tablet Take 650 mg by mouth at bedtime.    [provider]  apixaban (ELIQUIS) 5 MG TABS tablet Take 1 tablet (5 mg total) by mouth 2 (two) times daily. Needs Cardiology appt for Eliquis Refills, call office to schedule 07/03/23   Quintella Reichert, MD  DM-APAP-CPM (CORICIDIN HBP PO) Take 1 tablet by mouth every morning.    [provider]  felodipine (PLENDIL) 10 MG 24 hr tablet Take 20 mg by mouth every morning. 11/16/21   [provider]  furosemide (LASIX) 20 MG tablet Take 20 mg by mouth every morning. 09/20/21   [provider]  irbesartan (AVAPRO) 300 MG tablet Take 300 mg by mouth daily. 09/17/21   [provider]  KLOR-CON M20 20 MEQ tablet Take 20 mEq by mouth every morning. 11/21/21   [provider]  methylPREDNISolone (MEDROL DOSEPAK) 4 MG TBPK tablet Follow package insert 06/22/23   Curatolo, Adam, DO  metoprolol succinate (TOPROL-XL) 25 MG 24 hr tablet Take 1 tablet (25 mg total) by mouth daily. 07/04/23   Swinyer, Zachary George, NP  vitamin B-12  (CYANOCOBALAMIN) 1000 MCG tablet Take 1,000 mcg by mouth daily.    [provider]      Allergies    Diltiazem, Prednisone, Amoxicillin, and Tetracyclines & related    Review of Systems   Review of Systems  Constitutional:  Negative for chills and fever.  Respiratory:  Negative for shortness of breath.   Cardiovascular:  Negative for chest pain.  Gastrointestinal:  Negative for abdominal pain.  Genitourinary:  Negative for flank pain.  Musculoskeletal:  Positive for neck pain. Negative for back pain.  Skin:  Negative for rash and wound.  Neurological:  Negative for weakness, numbness and headaches.    Physical Exam Updated Vital Signs BP (!) 153/65 (BP Location: Left Arm)   Pulse 75   Temp 98.3 F (36.8 C)   Resp 15   Ht 1.6 m (5\' 3" )   Wt 59 kg   SpO2 96%   BMI 23.03 kg/m  Physical Exam Vitals and nursing note reviewed.  Constitutional:      Appearance: Normal appearance. She is well-developed.  HENT:     Head: Atraumatic.     Nose: Nose normal.     Mouth/Throat:     Mouth: Mucous membranes are moist.  Eyes:     General: No scleral icterus.    Conjunctiva/sclera: Conjunctivae normal.  Neck:     Trachea: No tracheal deviation.     Comments: No rigidity. No  swelling or mass. No skin lesions or erythema.  Cardiovascular:     Rate and Rhythm: Normal rate and regular rhythm.     Pulses: Normal pulses.     Heart sounds: Normal heart sounds.  Pulmonary:     Effort: Pulmonary effort is normal. No respiratory distress.     Breath sounds: Normal breath sounds.  Abdominal:     General: There is no distension.     Palpations: Abdomen is soft.     Tenderness: There is no abdominal tenderness.  Musculoskeletal:        General: No swelling.     Cervical back: Neck supple. No rigidity. No muscular tenderness.     Comments: C/t spine non tender, aligned. Bilateral trapezius muscular tenderness and lateral neck tenderness. No sts or skin changes noted.    Lymphadenopathy:     Cervical: No cervical adenopathy.  Skin:    General: Skin is warm and dry.     Findings: No rash.  Neurological:     Mental Status: She is alert.     Comments: Alert, speech normal. Motor/sens grossly intact bil. Equal grips.   Psychiatric:        Mood and Affect: Mood normal.     ED Results / Procedures / Treatments   Labs (all labs ordered are listed, but only abnormal results are displayed) Labs Reviewed - No data to display  EKG None  Radiology No results found.  Procedures Procedures    Medications Ordered in ED Medications  lidocaine (LIDODERM) 5 % 1 patch (1 patch Transdermal Patch Applied 07/07/23 1555)  predniSONE (DELTASONE) tablet 20 mg (has no administration in time range)  methocarbamol (ROBAXIN) tablet 500 mg (has no administration in time range)  HYDROcodone-acetaminophen (NORCO/VICODIN) 5-325 MG per tablet 1 tablet (1 tablet Oral Given 07/07/23 1554)    ED Course/ Medical Decision Making/ A&P                                 Medical Decision Making Problems Addressed: Chronic neck pain: chronic illness or injury with exacerbation, progression, or side effects of treatment that poses a threat to life or bodily functions Muscle spasms of neck: acute illness or injury with systemic symptoms  Amount and/or Complexity of Data Reviewed Independent Historian:     Details: Family, hx Labs:  Decision-making details documented in ED Course. Radiology: independent interpretation performed. Decision-making details documented in ED Course.  Risk Prescription drug management.   Reviewed nursing notes and prior charts for additional history.   Pt/family indicate prednisone worked well, requests new rx.   Recent imaging reviewed/interpreted by me - degenerative changes, no acute/severe process noted.   Prednisone po. Robaxin po.  Pt comfortable appearing.   Rec pcp f/u.  Return precautions provided.         Final Clinical  Impression(s) / ED Diagnoses Final diagnoses:  None    Rx / DC Orders ED Discharge Orders     None         Cathren Laine, MD 07/07/23 1658

## 2023-07-07 NOTE — ED Provider Triage Note (Signed)
Emergency Medicine Provider Triage Evaluation Note  Kristie Gillespie , a 89 y.o. female  was evaluated in triage.  Pt complains of neck pain. Was seen here with reassuring CT scan recently. Says pain has come back. No new neuro symptoms.   Review of Systems  Positive: Neck pain, constipation Negative: Numbness, weakness, bladder incontinence  Physical Exam  BP (!) 153/65 (BP Location: Left Arm)   Pulse 75   Temp 98.3 F (36.8 C)   Resp 15   Ht 5\' 3"  (1.6 m)   Wt 59 kg   SpO2 96%   BMI 23.03 kg/m  Gen:   Awake, no distress  Resp:  Normal effort  MSK:   Moves extremities without difficulty  Other:  Full strength of all extremities, intact sensation to light touch  Medical Decision Making  Medically screening exam initiated at 2:33 PM.  Appropriate orders placed.  Kristie Gillespie was informed that the remainder of the evaluation will be completed by another provider, this initial triage assessment does not replace that evaluation, and the importance of remaining in the ED until their evaluation is complete.   Rondel Baton, MD 07/07/23 1435

## 2023-07-07 NOTE — Discharge Instructions (Signed)
It was our pleasure to provide your ER care today - we hope that you feel better.  Take acetaminophen as need for pain. Take prednisone as prescribed. You may take robaxin as need for muscle pain/spasm.  You may also try gentle massage and/or heat therapy for symptom relief.  Follow up with your doctor in the next 2-3 weeks if symptoms fail to improve/resolve.  Return to ER if worse, new symptoms, fevers, chest pain, trouble breathing, numbness/weakness, intractable pain, or other concern.

## 2023-08-09 IMAGING — CR DG ABDOMEN 1V
2 series · 2 of 2 positions shown · non-contrast
Comparison: Same day CT

CLINICAL DATA: Constipation

EXAM:
ABDOMEN - 1 VIEW

[abdomen kub (1 of 2)]
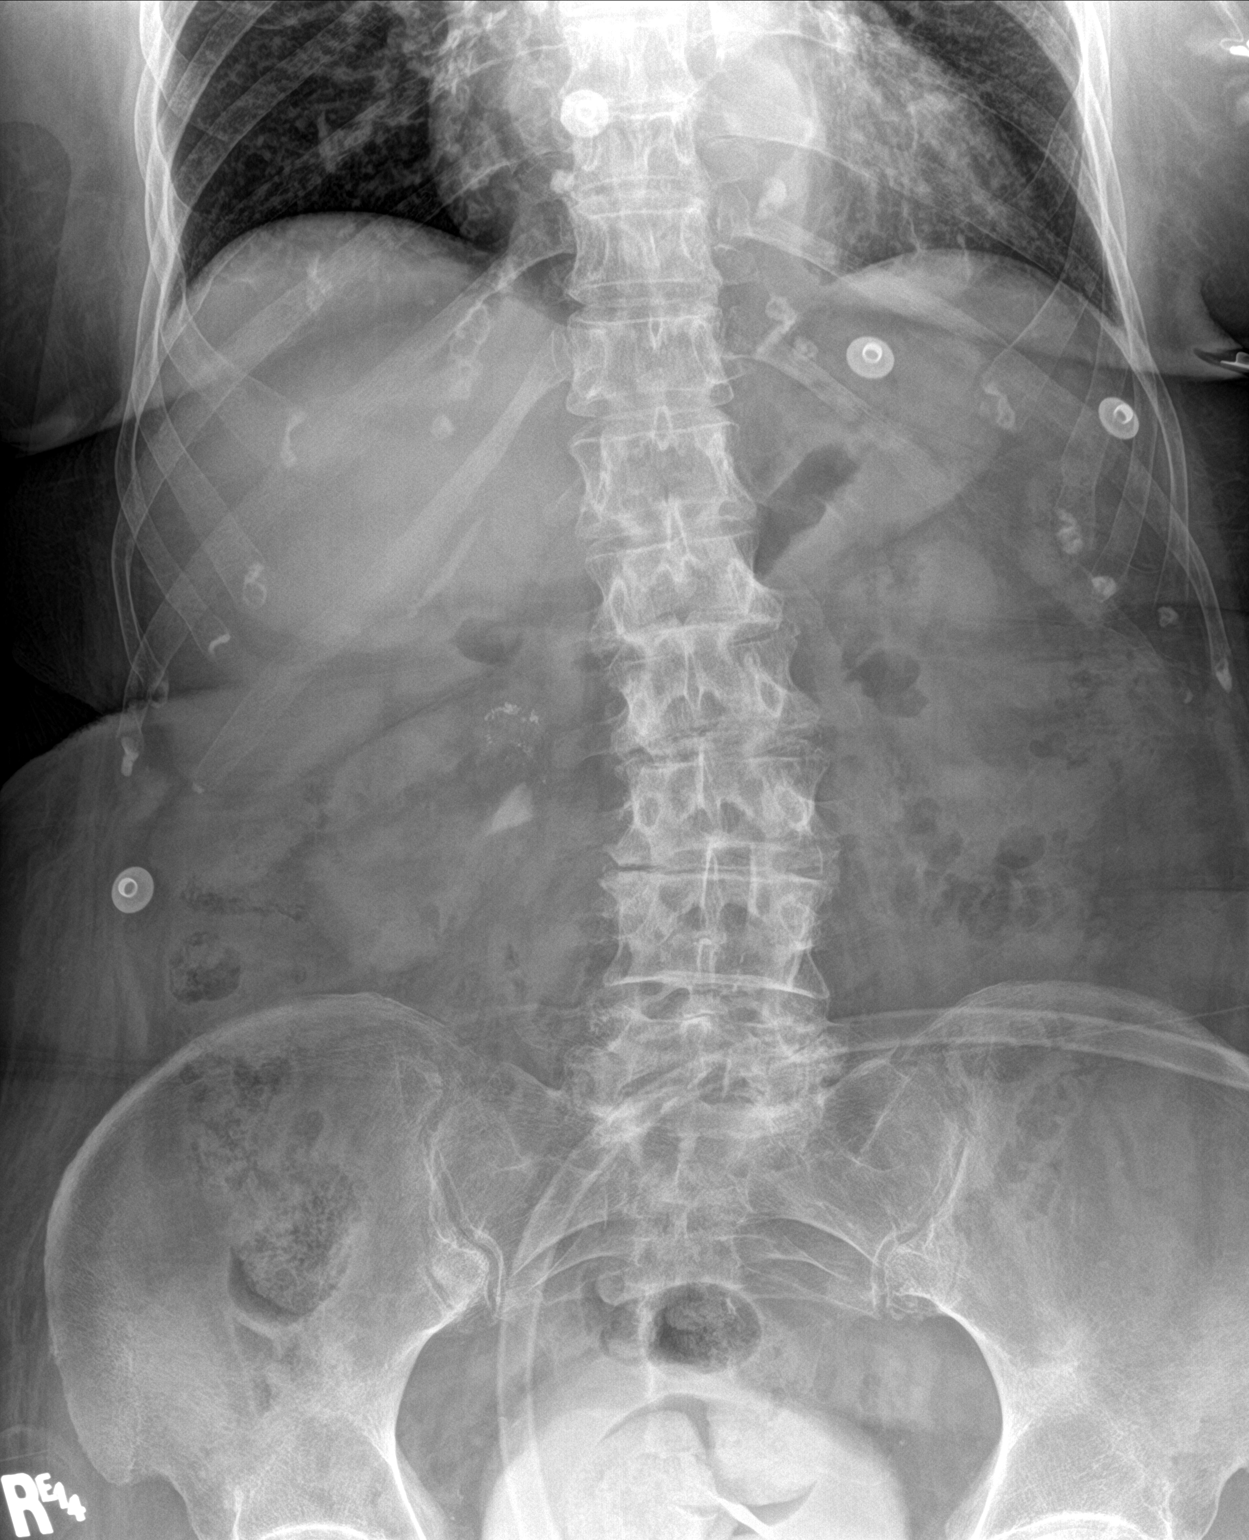

[abdomen kub (2 of 2)]
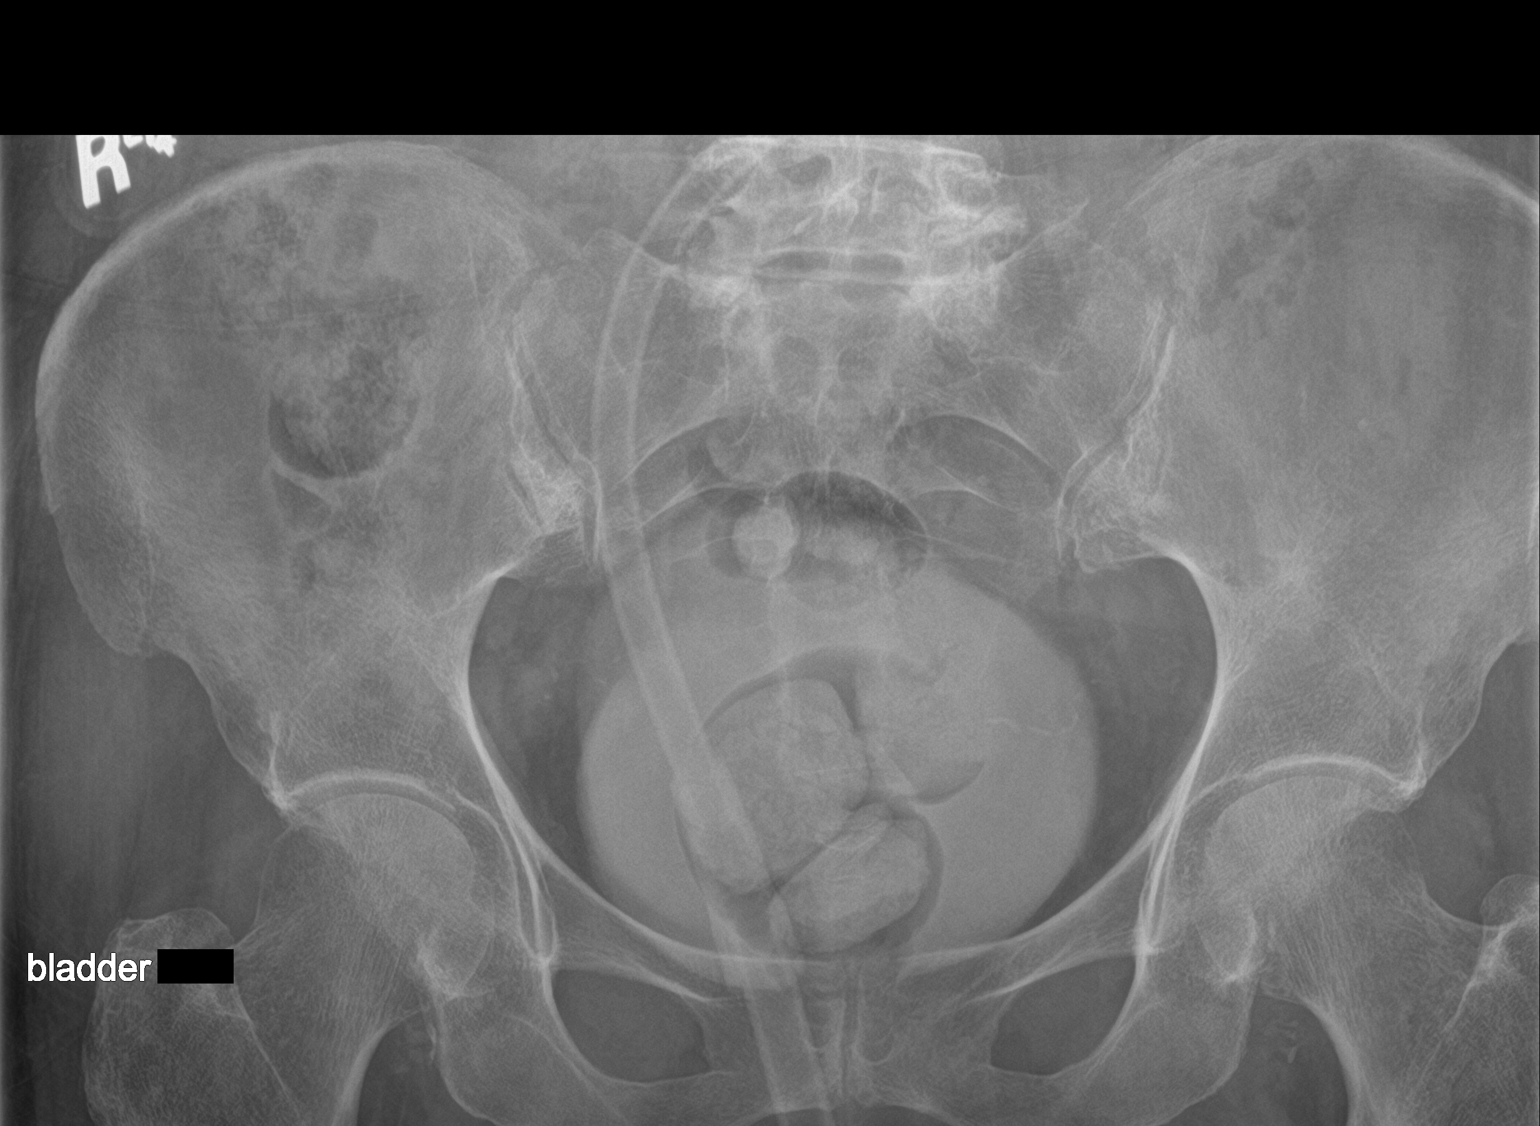

[2 of 2 positions shown; findings below may reference images not displayed]

FINDINGS: The bowel gas pattern is normal. No abnormal fecal retention.
Excreted contrast is noted within the right renal collecting system
and urinary bladder. No radio-opaque calculi or other significant
radiographic abnormality are seen.
IMPRESSION: Negative.

## 2023-08-10 IMAGING — US US ABDOMEN LIMITED
1 series · 14 of 25 positions shown · non-contrast
Comparison: CT done yesterday.

CLINICAL DATA: Vomiting, hypertension.

EXAM:
ULTRASOUND ABDOMEN LIMITED RIGHT UPPER QUADRANT

[Series 1: us abdomen limited ruq (liver/gb) · 14 of 73 slices shown]
[im 1/73]
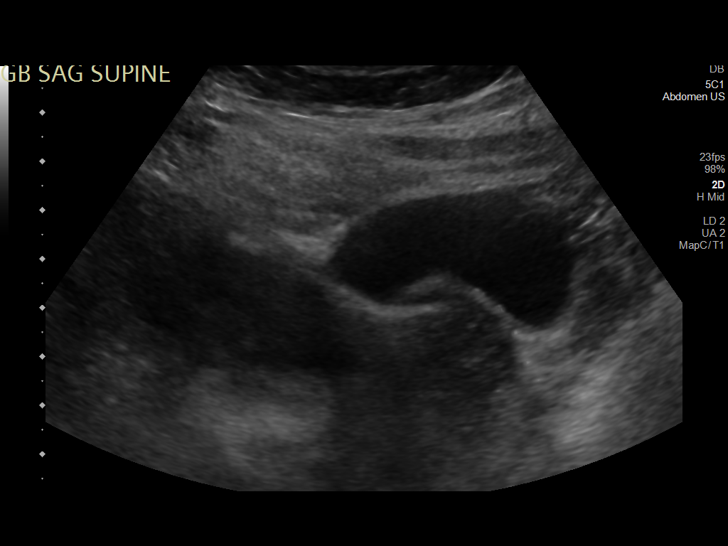
[im 7/73]
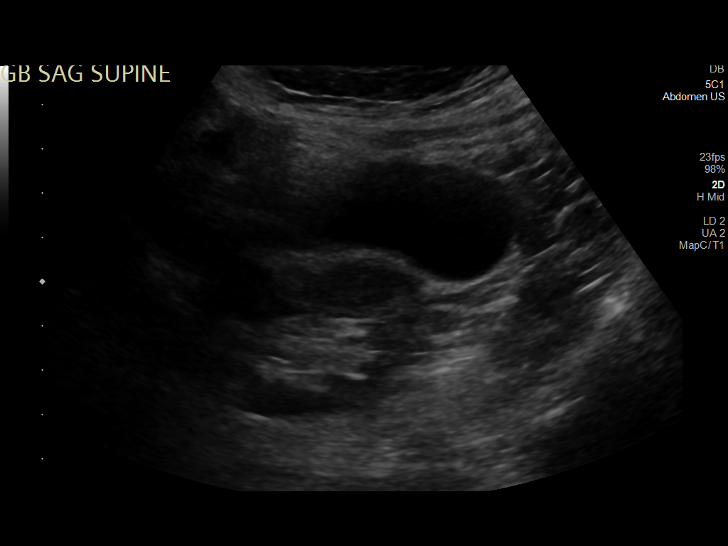
[im 13/73]
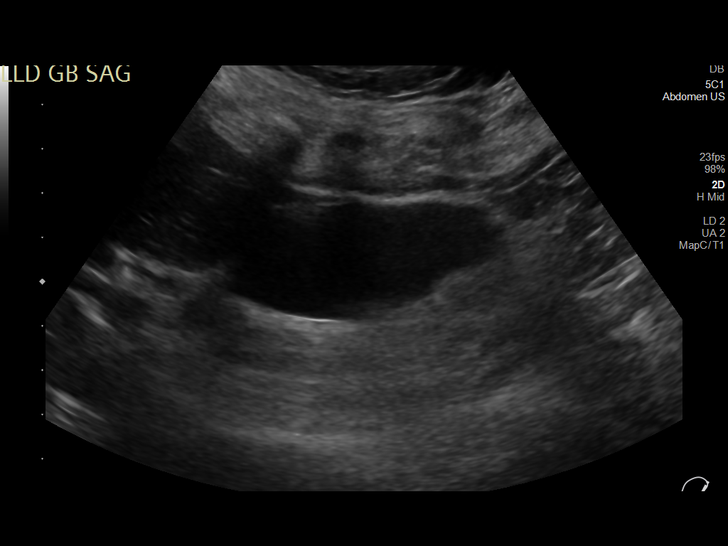
[im 19/73]
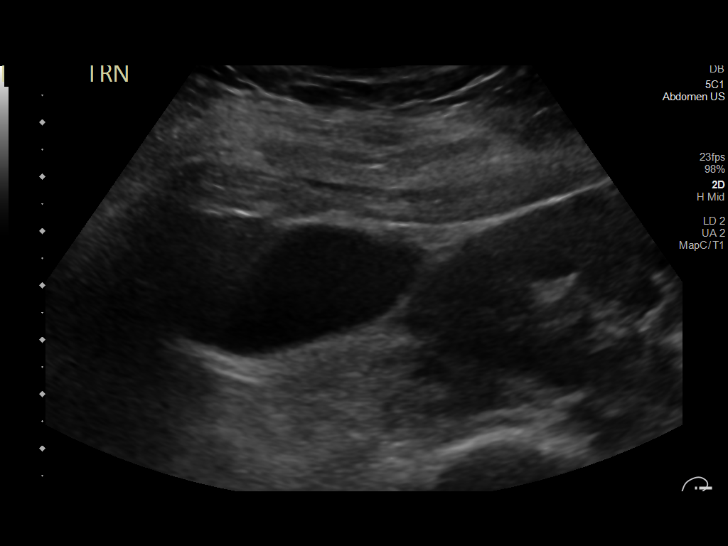
[im 25/73]
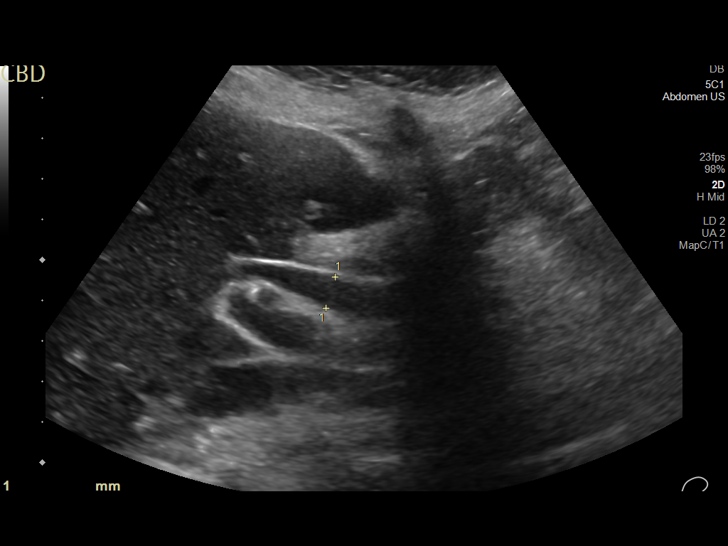
[im 28/73]
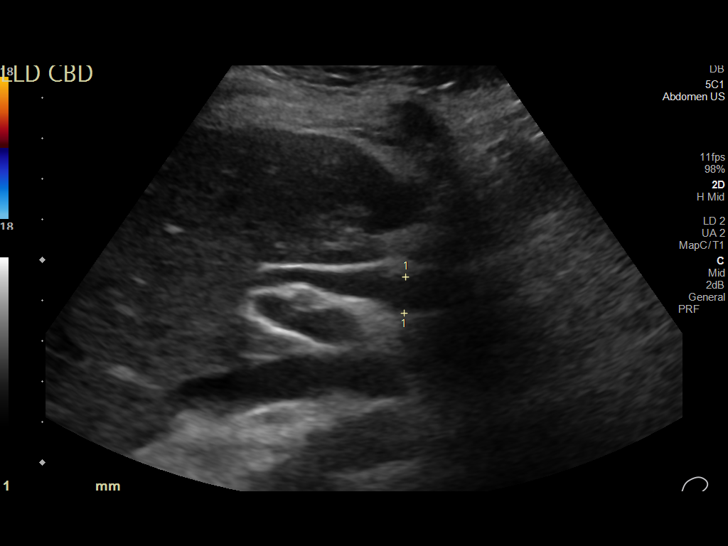
[im 34/73]
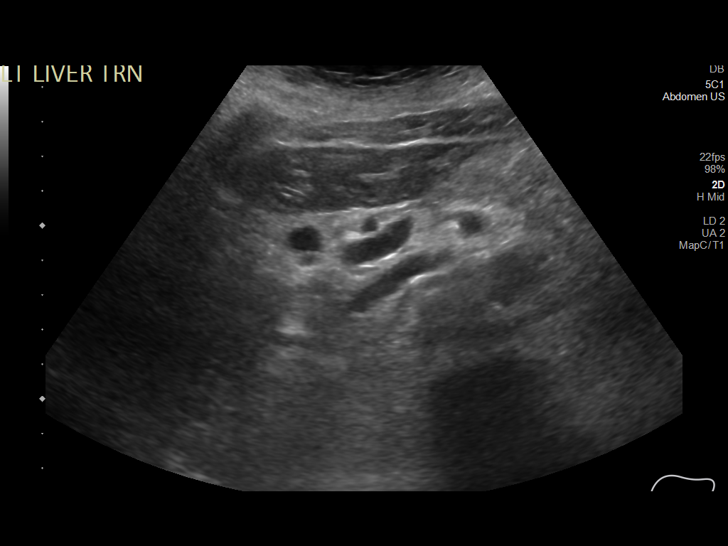
[im 40/73]
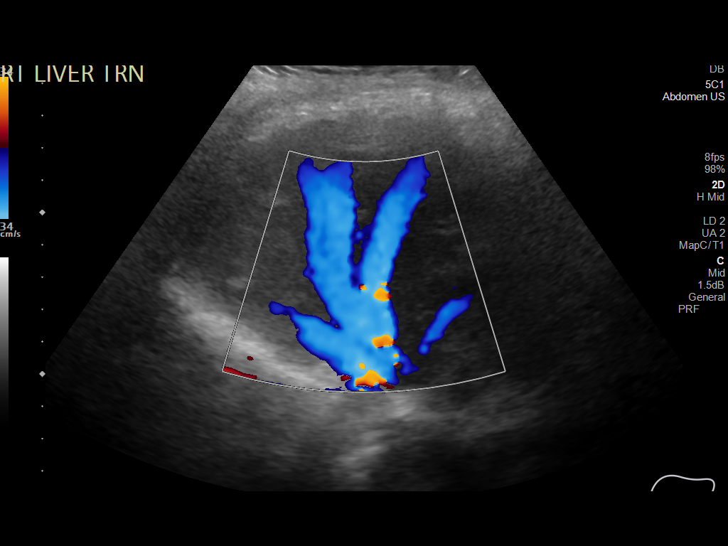
[im 46/73]
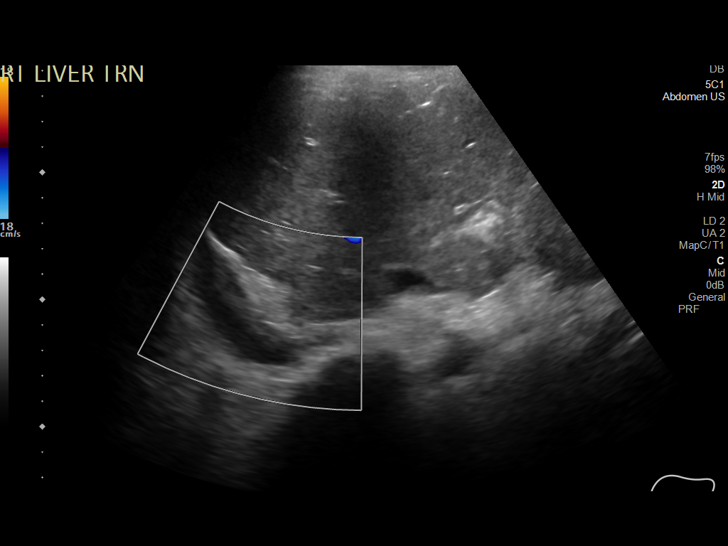
[im 49/73]
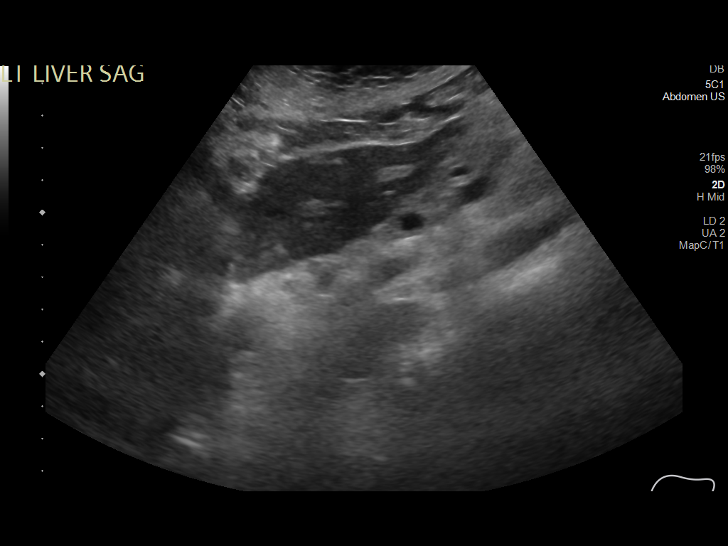
[im 55/73]
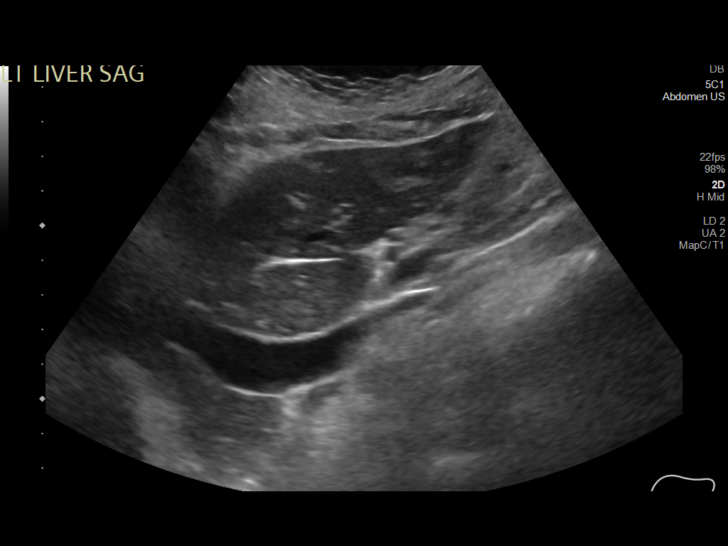
[im 61/73]
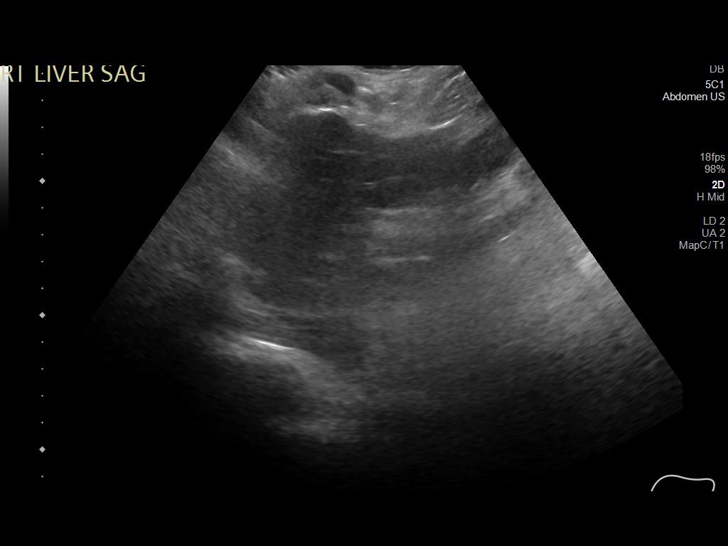
[im 67/73]
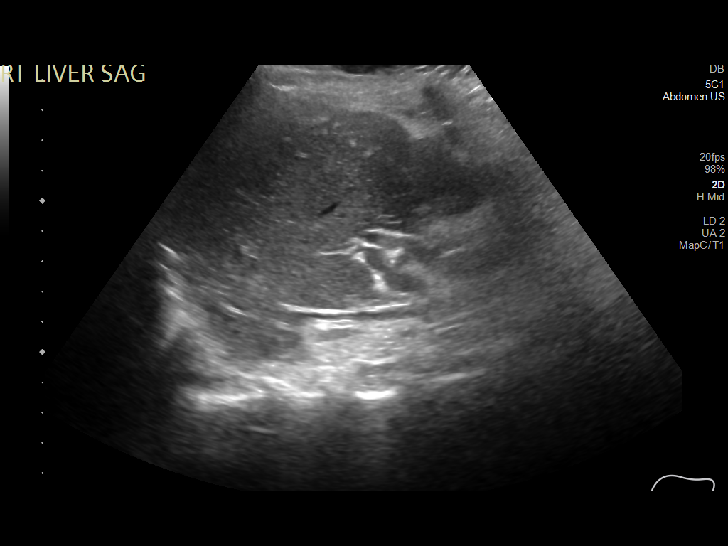
[im 73/73]
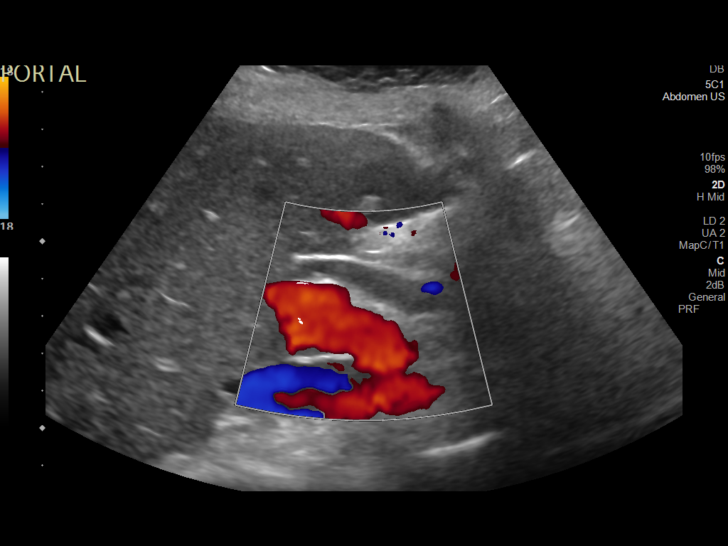

[14 of 25 positions shown; findings below may reference images not displayed]

FINDINGS: Gallbladder:

No gallstones or wall thickening visualized. No sonographic Murphy
sign noted by sonographer.

Common bile duct:

Diameter: There is 9.0 mm. No intraductal stone or obstructing
lesion identified. This can be normal at this age.

Liver:

No focal lesion identified. Within normal limits in parenchymal
echogenicity. Portal vein is patent on color Doppler imaging with
normal direction of blood flow towards the liver.

Other: Small amount of free fluid in the peritoneal space.
IMPRESSION: No evidence of hepatobiliary disease. Common duct measures up to 9
mm, which can be normal at this age. No sign of stone disease or
obstructing mass.

## 2024-06-25 NOTE — Progress Notes (Signed)
 Internal Medicine at [facility]   ASSESSMENT/PLAN:  1. Essential hypertension (Primary): -Chronic; controlled at goal of less than 130/80. -Check labs. -D/c hydrochlorothiazide 12.5 mg daily. -Continue avapro  300 mg daily, toprol  xl 25 mg daily and plendil  xr 5 mg daily. - TSH; Future - irbesartan  (AVAPRO ) 300 mg tablet; Take 1 tablet (300 mg total) by mouth daily.  Dispense: 90 tablet; Refill: 3  2. Chronic kidney disease (CKD) stage G3a/A1, moderately decreased glomerular filtration rate (GFR) between 45-59 mL/min/1.73 square meter and albuminuria creatinine ratio less than 30 mg/g (CMD): -Chronic; -Drink 32 oz of water per day. -Check labs. -D/c hydrochlorothiazide today. Renal CT imaging done 2023. - CBC with Differential; Future - Comprehensive Metabolic Panel; Future - Urinalysis with Microscopic; Future - Urine Culture; Future - Magnesium; Future - Vitamin D, 25-Hydroxy; Future - Albumin, Random Urine; Future  3. Unsteady/fall, initial encounter/On continuous oral anticoagulation/Closed head injury, initial encounter: -Acute unsteadiness x 3 weeks. 34 lbs of weight loss since 05/2024. Adjust BP meds as above. D/c tramadol  and muscle relaxer for now. Use tylenol  xr 650 mg BID instead. -Check STAT CT head and CXR. -Labs as above. -Notify the office of any persistence/worsening. - CT Head WO Contrast; Future - XR Chest 2 Views; Future  Plan: Patient expresses understanding of their current medications and use.  If a new prescription was given today, then I discussed potential side effects, drug interactions, instructions for taking the medication, and the consequences of not taking it. Patient verbalized an understanding of these instructions. Patient is able to verbalize understanding of the care plan discussed today. Patient's medical and personal goals were discussed today. Barriers to current goals:  None Follow up as discussed in 3 months for: chron cond Call sooner if  needed.  Chief Complaint  Patient presents with  . Dizziness  . Fall    SUBJECTIVE:  Kristie Gillespie is a 89 y.o. female that presents to clinic today regarding the following issues: unsteady, fall  Kristie Gillespie presents for an acute visit to discuss ~3 weeks of unsteadiness. She also had a fall 2 days ago; from her bed to the floor where she sustained a head injury. She continues on chronic oral anticoagulation. She has bruising of her left forehead/temple. Her weight is down 34 lbs since 05/2023; BMI is   Wt Readings from Last 3 Encounters:  06/25/24 47.5 kg (104 lb 12.8 oz)  01/22/24 54.5 kg (120 lb 1.6 oz)  09/21/23 55.4 kg (122 lb 1.6 oz)   She has HTN; BP is controlled at goal of less than 130/80 on antihypertensive drug regimen.  BP Readings from Last 3 Encounters:  06/25/24 124/61  01/22/24 151/78  09/21/23 123/62   Slightly low potassium (3.4).  Stable moderate renal decline w/ GFR 55-59 and Cr 0.9-0.95. CrCl 29 mL/min.  Stable mild isolated increase in bilirubin (1.6).  Normal wbc, hemoglobin and platelets.  Thyroid  function is normal.  LDL adequately controlled (98).  3 month average of sugar has normalized w/ A1c 5.5% (5.8% prior). FBG is normal. Urine is normal; no UTI  Vit d is low-normal (33). Magnesium is normal.   She had a RUQ US  in 2023 which noted:  FINDINGS:  Gallbladder:   No gallstones or wall thickening visualized. No sonographic Murphy  sign noted by sonographer.   Common bile duct:   Diameter: There is 9.0 mm. No intraductal stone or obstructing  lesion identified. This can be normal at this age.   Liver:   No focal lesion  identified. Within normal limits in parenchymal  echogenicity. Portal vein is patent on color Doppler imaging with  normal direction of blood flow towards the liver.   Other: Small amount of free fluid in the peritoneal space.   IMPRESSION:  No evidence of hepatobiliary disease. Common duct measures up to 9  mm, which can be  normal at this age. No sign of stone disease or  obstructing mass.   In 2023, she had a CT chest abdomen pelvis which noted:  IMPRESSION:  1. Aortic and branch vessel atherosclerosis without aortic aneurysm,  dissection or stenosis.  2. Right renal artery and IMA stenoses as described above. No other  flow-limiting visceral artery stenosis. No inflow stenosis.  3. Cardiomegaly with biatrial enlargement, mild interstitial edema  in the bases and small layering pleural effusions. Findings  consistent with CHF or fluid overload. There is no body wall edema.  Only trace ascites in the posterior deep pelvis.  4. IVC and hepatic vein contrast reflux consistent with right heart  dysfunction, tricuspid regurgitation or rapid contrast injection.  5. Slightly prominent bilateral hilar lymph nodes and borderline  size mediastinal nodes. No bulky adenopathy.  6. Upper and lower lobe bronchitic features.  7. Ground-glass opacities in the lung fields, predominantly  centrally distributed and could indicate ground-glass edema or  pneumonitis/bronchopneumonia.  8. Some images may suggest faint perivesical edema. Query early  cystitis. No findings of acute pyelonephritis.  9. Prominent common bile duct, but could be normal for age.  Laboratory and clinical correlation suggested.  10. Thoracic kyphosis with mild, chronic appearing anterior wedge  deformities of the T6, T7 and L1 vertebrae. Osteopenia and  degenerative change.  11. Diverticulosis without evidence of diverticulitis and additional  findings described above.   HISTORY: I have reviewed the patients problem list, current medications, allergies, and social history and updated them as needed.  Medical History[1] Family History[2]  Surgical History[3]  Current Medications[4]  Kristie Gillespie  reports that she has never smoked. She has never used smokeless tobacco.  ROS: The patient denies any fevers, chills, night sweats, headache,  blurred vision, sore throat, coughing, shortness of breath, sputum production, chest pain, abdominal pain, nausea/vomiting/diarrhea, blood in the stool, dysuria, blood in urine, or leg swelling.   Review of Systems - All other systems reviewed are negative except as noted above.  OBJECTIVE:  Kristie Gillespie  height is 1.6 m (5' 3) and weight is 47.5 kg (104 lb 12.8 oz). Her blood pressure is 124/61 and her pulse is 50. Her oxygen saturation is 90%.   PHYSICAL:  General Appearance: White female w BMI 18.6. Alert and appears in no acute distress. Sitting comfortably in chair w/ daughter present. Respiratory: Good air movement throughout. No accessory muscle use, increased work of breathing or respiratory distress. Clear to auscultation in all lung fields. Normal effort of breathing, non-labored. No adventitious lung sounds including wheezes, rhochi or crackles. Cardiovascular: Regular rate and rhythm Extremities: 2+ radial pulse present.  Skin: Warm, dry. Half-dollar sized ecchymosis left forehead/temple. Psychiatric: Pleasant. Mood and affect normal. Not anxious or tearful. Neurological: Alert. Able to stand w/ assistance. Strength of all four extremities intact against gravity. Seated in a wheelchair.  Rosalva Catchings, PA-C Internal Medicine at [facility] 06/25/2024 11:45 AM        [1] Past Medical History: Diagnosis Date  . Cancer    (CMD)    skin  . Elevated bilirubin 08/01/2019   07/2019: Mild; pending repeat  . Hypercholesterolemia   .  Hypertension   [2] Family History Problem Relation Name Age of Onset  . Heart disease Mother    . Early death Father    [3] Past Surgical History: Procedure Laterality Date  . OTHER SURGICAL HISTORY     Procedure: OTHER SURGICAL HISTORY (right ear surgery)  [4] Current Outpatient Medications  Medication Sig Dispense Refill  . calcium  carbonate-vitamin D3 600 mg-5 mcg (200 unit) tab Take  by mouth.    . cyanocobalamin  (VITAMIN B12) 1,000 mcg  tablet Take 1,000 mcg by mouth Once Daily.    . Eliquis  5 mg tab TAKE 1 TABLET BY MOUTH TWICE A DAY 90 tablet 1  . felodipine  (PLENDIL ) 5 mg 24 hr tablet Take 1 tablet (5 mg total) by mouth daily. 90 tablet 3  . furosemide  (LASIX ) 20 mg tablet TAKE 2 TABLETS (40 MG TOTAL) BY MOUTH DAILY. 180 tablet 1  . metoprolol  succinate (TOPROL  XL) 25 mg 24 hr tablet Take 1 tablet (25 mg total) by mouth daily. 90 tablet 3  . potassium chloride  (KLOR-CON ) 20 mEq packet Take 20 mEq by mouth daily. 90 packet 3  . rosuvastatin  (CRESTOR ) 5 mg tablet Take 1 tablet (5 mg total) by mouth daily. 90 tablet 3  . irbesartan  (AVAPRO ) 300 mg tablet Take 1 tablet (300 mg total) by mouth daily. 90 tablet 3   No current facility-administered medications for this visit.

## 2024-06-26 NOTE — Progress Notes (Signed)
 LMTCB

## 2024-06-26 NOTE — Telephone Encounter (Signed)
 Lvm to call back

## 2024-06-27 ENCOUNTER — Observation Stay (HOSPITAL_COMMUNITY)
Admission: EM | Admit: 2024-06-27 | Discharge: 2024-06-28 | Disposition: A | Discharge status: Home / Self Care | Attending: Infectious Diseases | Admitting: Infectious Diseases

## 2024-06-27 ENCOUNTER — Encounter (HOSPITAL_COMMUNITY): Payer: Self-pay

## 2024-06-27 ENCOUNTER — Other Ambulatory Visit: Payer: Self-pay

## 2024-06-27 DIAGNOSIS — E876 Hypokalemia: Secondary | ICD-10-CM | POA: Insufficient documentation

## 2024-06-27 DIAGNOSIS — R634 Abnormal weight loss: Secondary | ICD-10-CM | POA: Diagnosis not present

## 2024-06-27 DIAGNOSIS — E785 Hyperlipidemia, unspecified: Secondary | ICD-10-CM | POA: Diagnosis not present

## 2024-06-27 DIAGNOSIS — N179 Acute kidney failure, unspecified: Secondary | ICD-10-CM | POA: Diagnosis not present

## 2024-06-27 DIAGNOSIS — Z8679 Personal history of other diseases of the circulatory system: Secondary | ICD-10-CM | POA: Diagnosis not present

## 2024-06-27 DIAGNOSIS — I130 Hypertensive heart and chronic kidney disease with heart failure and stage 1 through stage 4 chronic kidney disease, or unspecified chronic kidney disease: Secondary | ICD-10-CM | POA: Insufficient documentation

## 2024-06-27 DIAGNOSIS — I5030 Unspecified diastolic (congestive) heart failure: Secondary | ICD-10-CM | POA: Diagnosis not present

## 2024-06-27 DIAGNOSIS — N1832 Chronic kidney disease, stage 3b: Secondary | ICD-10-CM | POA: Diagnosis not present

## 2024-06-27 DIAGNOSIS — R799 Abnormal finding of blood chemistry, unspecified: Secondary | ICD-10-CM | POA: Diagnosis present

## 2024-06-27 DIAGNOSIS — Z7901 Long term (current) use of anticoagulants: Secondary | ICD-10-CM | POA: Insufficient documentation

## 2024-06-27 DIAGNOSIS — I4891 Unspecified atrial fibrillation: Secondary | ICD-10-CM | POA: Insufficient documentation

## 2024-06-27 DIAGNOSIS — H9190 Unspecified hearing loss, unspecified ear: Secondary | ICD-10-CM | POA: Diagnosis not present

## 2024-06-27 DIAGNOSIS — Z79899 Other long term (current) drug therapy: Secondary | ICD-10-CM | POA: Diagnosis not present

## 2024-06-27 LAB — CBC WITH DIFFERENTIAL/PLATELET
Abs Immature Granulocytes: 0.02 K/uL (ref 0.00–0.07)
Basophils Absolute: 0 K/uL (ref 0.0–0.1)
Basophils Relative: 1 %
Eosinophils Absolute: 0 K/uL (ref 0.0–0.5)
Eosinophils Relative: 0 %
HCT: 49.8 % — ABNORMAL HIGH (ref 36.0–46.0)
Hemoglobin: 16.7 g/dL — ABNORMAL HIGH (ref 12.0–15.0)
Immature Granulocytes: 0 %
Lymphocytes Relative: 43 %
Lymphs Abs: 3.6 K/uL (ref 0.7–4.0)
MCH: 32.4 pg (ref 26.0–34.0)
MCHC: 33.5 g/dL (ref 30.0–36.0)
MCV: 96.5 fL (ref 80.0–100.0)
Monocytes Absolute: 0.8 K/uL (ref 0.1–1.0)
Monocytes Relative: 10 %
Neutro Abs: 3.7 K/uL (ref 1.7–7.7)
Neutrophils Relative %: 46 %
Platelets: 190 K/uL (ref 150–400)
RBC: 5.16 MIL/uL — ABNORMAL HIGH (ref 3.87–5.11)
RDW: 13.1 % (ref 11.5–15.5)
WBC: 8.2 K/uL (ref 4.0–10.5)
nRBC: 0 % (ref 0.0–0.2)

## 2024-06-27 LAB — BASIC METABOLIC PANEL WITH GFR
Anion gap: 15 (ref 5–15)
Anion gap: 11 (ref 5–15)
BUN: 65 mg/dL — ABNORMAL HIGH (ref 8–23)
BUN: 51 mg/dL — ABNORMAL HIGH (ref 8–23)
CO2: 36 mmol/L — ABNORMAL HIGH (ref 22–32)
CO2: 34 mmol/L — ABNORMAL HIGH (ref 22–32)
Calcium: 10 mg/dL (ref 8.9–10.3)
Calcium: 9.6 mg/dL (ref 8.9–10.3)
Chloride: 88 mmol/L — ABNORMAL LOW (ref 98–111)
Chloride: 93 mmol/L — ABNORMAL LOW (ref 98–111)
Creatinine, Ser: 1.51 mg/dL — ABNORMAL HIGH (ref 0.44–1.00)
Creatinine, Ser: 1.37 mg/dL — ABNORMAL HIGH (ref 0.44–1.00)
GFR, Estimated: 31 mL/min — ABNORMAL LOW (ref >60)
GFR, Estimated: 35 mL/min — ABNORMAL LOW (ref >60)
Glucose, Bld: 110 mg/dL — ABNORMAL HIGH (ref 70–99)
Glucose, Bld: 131 mg/dL — ABNORMAL HIGH (ref 70–99)
Potassium: 2.9 mmol/L — ABNORMAL LOW (ref 3.5–5.1)
Potassium: 3.4 mmol/L — ABNORMAL LOW (ref 3.5–5.1)
Sodium: 139 mmol/L (ref 135–145)
Sodium: 138 mmol/L (ref 135–145)

## 2024-06-27 LAB — MAGNESIUM: Magnesium: 2.4 mg/dL (ref 1.7–2.4)

## 2024-06-27 MED ORDER — METHOCARBAMOL 500 MG PO TABS
500.0000 mg | ORAL_TABLET | Freq: Three times a day (TID) | ORAL | Status: DC | PRN
  Filled 2024-06-27: qty 1

## 2024-06-27 MED ORDER — VITAMIN B-12 1000 MCG PO TABS
1000.0000 ug | ORAL_TABLET | Freq: Every day | ORAL | Status: DC
  Administered 2024-06-28: 1000 ug via ORAL
  Filled 2024-06-27: qty 1

## 2024-06-27 MED ORDER — ACETAMINOPHEN 325 MG PO TABS: 650.0000 mg | ORAL_TABLET | Freq: Four times a day (QID) | ORAL | Status: DC | PRN

## 2024-06-27 MED ORDER — POTASSIUM CHLORIDE CRYS ER 20 MEQ PO TBCR
40.0000 meq | EXTENDED_RELEASE_TABLET | Freq: Once | ORAL | Status: AC
  Administered 2024-06-27: 40 meq via ORAL
  Filled 2024-06-27: qty 2

## 2024-06-27 MED ORDER — APIXABAN 2.5 MG PO TABS
2.5000 mg | ORAL_TABLET | Freq: Two times a day (BID) | ORAL | Status: DC
  Administered 2024-06-27 – 2024-06-28 (×2): 2.5 mg via ORAL
  Filled 2024-06-27 (×2): qty 1

## 2024-06-27 MED ORDER — ACETAMINOPHEN 650 MG RE SUPP: 650.0000 mg | Freq: Four times a day (QID) | RECTAL | Status: DC | PRN

## 2024-06-27 MED ORDER — HYDRALAZINE HCL 10 MG PO TABS: 10.0000 mg | ORAL_TABLET | Freq: Three times a day (TID) | ORAL | Status: DC | PRN

## 2024-06-27 MED ORDER — APIXABAN 5 MG PO TABS: 5.0000 mg | ORAL_TABLET | Freq: Two times a day (BID) | ORAL | Status: DC

## 2024-06-27 MED ORDER — POTASSIUM CHLORIDE 10 MEQ/100ML IV SOLN
10.0000 meq | INTRAVENOUS | Status: AC
  Administered 2024-06-27 (×3): 10 meq via INTRAVENOUS
  Filled 2024-06-27 (×3): qty 100

## 2024-06-27 MED ORDER — ROSUVASTATIN CALCIUM 5 MG PO TABS
5.0000 mg | ORAL_TABLET | Freq: Every day | ORAL | Status: DC
  Administered 2024-06-28: 5 mg via ORAL
  Filled 2024-06-27: qty 1

## 2024-06-27 MED ORDER — TRAMADOL HCL 50 MG PO TABS
50.0000 mg | ORAL_TABLET | Freq: Two times a day (BID) | ORAL | Status: DC | PRN
  Filled 2024-06-27: qty 1

## 2024-06-27 MED ORDER — POLYETHYLENE GLYCOL 3350 17 G PO PACK
17.0000 g | PACK | Freq: Every day | ORAL | Status: DC | PRN
  Administered 2024-06-27: 17 g via ORAL
  Filled 2024-06-27: qty 1

## 2024-06-27 MED ORDER — SODIUM CHLORIDE 0.9% FLUSH
3.0000 mL | Freq: Two times a day (BID) | INTRAVENOUS | Status: DC
  Administered 2024-06-27 – 2024-06-28 (×2): 3 mL via INTRAVENOUS

## 2024-06-27 MED ORDER — FELODIPINE ER 5 MG PO TB24
5.0000 mg | ORAL_TABLET | Freq: Every day | ORAL | Status: DC
  Administered 2024-06-28: 5 mg via ORAL
  Filled 2024-06-27 (×2): qty 1

## 2024-06-27 MED ORDER — LACTATED RINGERS IV BOLUS
1000.0000 mL | Freq: Once | INTRAVENOUS | Status: AC
  Administered 2024-06-27: 1000 mL via INTRAVENOUS

## 2024-06-27 NOTE — Hospital Course (Signed)
 r

## 2024-06-27 NOTE — Progress Notes (Signed)
 Spoke with [employer] (on HIPAA) and advised of critical lab results, daughter verbalized understanding. Reports, she will call EMS now to have patient transported to the hospital for further evaluation.

## 2024-06-27 NOTE — Plan of Care (Signed)
   Problem: Education: Goal: Knowledge of General Education information will improve Description Including pain rating scale, medication(s)/side effects and non-pharmacologic comfort measures Outcome: Progressing   Problem: Health Behavior/Discharge Planning: Goal: Ability to manage health-related needs will improve Outcome: Progressing

## 2024-06-27 NOTE — Progress Notes (Incomplete)
 Internal Medicine Teaching Service Attending Note Date: 06/27/2024  Patient name: Kristie Gillespie  Medical record number: [id number]  Date of birth: 10-01-1926   I have seen and evaluated Kristie Gillespie and discussed their care with the Residency Team.   89 yo F with hx of HTN, hyperlipidemia, MI, hearing loss, was seen by her PCP and noted to have AKI and hypokalemia.  Her family notes recent poor po intake.  She was taken off her hydrochlorothiazide on 9-23 by her PCP.  She is without complaints currently.   Physical Exam: Blood pressure (!) 109/51, pulse (!) 51, temperature 97.9 F (36.6 C), temperature source Oral, resp. rate 13, height 5' 3 (1.6 m), weight 47.6 kg, SpO2 99%. General appearance: alert, cooperative, and no distress Eyes: negative findings: pupils equal, round, reactive to light and accomodation Throat: normal findings: oropharynx pink & moist without lesions or evidence of thrush Neck: no adenopathy and supple, symmetrical, trachea midline Resp: clear to auscultation bilaterally Cardio: regular rate and rhythm GI: normal findings: bowel sounds normal and soft, non-tender Extremities: edema trace, varicose veins noted, and venous stasis dermatitis noted  Lab results: Results for orders placed or performed during the hospital encounter of 06/27/24 (from the past 24 hours)  CBC with Differential     Status: Abnormal   Collection Time: 06/27/24 10:42 AM  Result Value Ref Range   WBC 8.2 4.0 - 10.5 K/uL   RBC 5.16 (H) 3.87 - 5.11 MIL/uL   Hemoglobin 16.7 (H) 12.0 - 15.0 g/dL   HCT 50.1 (H) 63.9 - 53.9 %   MCV 96.5 80.0 - 100.0 fL   MCH 32.4 26.0 - 34.0 pg   MCHC 33.5 30.0 - 36.0 g/dL   RDW 86.8 88.4 - 84.4 %   Platelets 190 150 - 400 K/uL   nRBC 0.0 0.0 - 0.2 %   Neutrophils Relative % 46 %   Neutro Abs 3.7 1.7 - 7.7 K/uL   Lymphocytes Relative 43 %   Lymphs Abs 3.6 0.7 - 4.0 K/uL   Monocytes Relative 10 %   Monocytes Absolute 0.8 0.1 - 1.0 K/uL    Eosinophils Relative 0 %   Eosinophils Absolute 0.0 0.0 - 0.5 K/uL   Basophils Relative 1 %   Basophils Absolute 0.0 0.0 - 0.1 K/uL   Immature Granulocytes 0 %   Abs Immature Granulocytes 0.02 0.00 - 0.07 K/uL  Basic metabolic panel with GFR     Status: Abnormal   Collection Time: 06/27/24 11:38 AM  Result Value Ref Range   Sodium 139 135 - 145 mmol/L   Potassium 2.9 (L) 3.5 - 5.1 mmol/L   Chloride 88 (L) 98 - 111 mmol/L   CO2 36 (H) 22 - 32 mmol/L   Glucose, Bld 110 (H) 70 - 99 mg/dL   BUN 65 (H) 8 - 23 mg/dL   Creatinine, Ser 8.48 (H) 0.44 - 1.00 mg/dL   Calcium  10.0 8.9 - 10.3 mg/dL   GFR, Estimated 31 (L) >60 mL/min   Anion gap 15 5 - 15  Magnesium     Status: None   Collection Time: 06/27/24 11:38 AM  Result Value Ref Range   Magnesium 2.4 1.7 - 2.4 mg/dL    Imaging results:  No results found.  Assessment and Plan: I agree with the formulated Assessment and Plan with the following changes:  Hypokalemia AKI (last Cr 1.03 2023)  Will gently hydrate her as she tolerates Her elevated Cr and h/h suggest she is  dehydrated Will replete her K Her BP appears ok without the hydrochlorothiazide, will monitor.    Eben Reyes BROCKS, MD

## 2024-06-27 NOTE — ED Triage Notes (Signed)
 Pt from home, pcpc called pt about AKI and hypokalemia. Pt is axox4. VSS. Denies chest pain

## 2024-06-27 NOTE — ED Provider Notes (Signed)
 Kristie Gillespie EMERGENCY DEPARTMENT AT [facility] Provider Note   CSN: [id number] Arrival date & time: 06/27/24  1023     Patient presents with: Abnomral labs   Kristie Gillespie is a 89 y.o. female.   Kristie Gillespie is a 89 y.o. female with hypertension, hyperlipidemia, MI, hearing loss, who presents to the emergency department for evaluation of abnormal labs.  Patient had a routine follow-up appointment 2 days ago and was called this morning and notified that their lab work showed an acute kidney injury and hypokalemia.  Patient has not had any recent vomiting or diarrhea but family member reports that she has had very poor intake.  Daughter does report that she was taken off of HCTZ at her visit on Tuesday.  Daughter reports that the patient has almost complete hearing loss, so it was difficult to get her to contribute to history.  She denies any current pain.  Daughter denies any other medication changes.  The history is provided by the patient and medical records.       Prior to Admission medications   Medication Sig Start Date End Date Taking? Authorizing Provider  acetaminophen  (TYLENOL ) 650 MG CR tablet Take 650 mg by mouth at bedtime.    [provider]  apixaban  (ELIQUIS ) 5 MG TABS tablet Take 1 tablet (5 mg total) by mouth 2 (two) times daily. Needs Cardiology appt for Eliquis  Refills, call office to schedule 07/03/23   Shlomo Wilbert SAUNDERS, MD  DM-APAP-CPM (CORICIDIN HBP PO) Take 1 tablet by mouth every morning.    [provider]  felodipine  (PLENDIL ) 10 MG 24 hr tablet Take 20 mg by mouth every morning. 11/16/21   [provider]  felodipine  (PLENDIL ) 5 MG 24 hr tablet Take 5 mg by mouth daily.    [provider]  furosemide  (LASIX ) 20 MG tablet Take 20 mg by mouth every morning. 09/20/21   [provider]  irbesartan  (AVAPRO ) 300 MG tablet Take 300 mg by mouth daily. 09/17/21   [provider]   irbesartan -hydrochlorothiazide (AVALIDE) 300-12.5 MG tablet Take 1 tablet by mouth daily. 06/21/24   [provider]  KLOR-CON  20 MEQ packet Take 20 mEq by mouth daily. 06/21/24   [provider]  KLOR-CON  M20 20 MEQ tablet Take 20 mEq by mouth every morning. 11/21/21   [provider]  methocarbamol  (ROBAXIN ) 500 MG tablet Take 1 tablet (500 mg total) by mouth every 8 (eight) hours as needed (muscle spasm/pain). 07/07/23   Bernard Drivers, MD  methylPREDNISolone  (MEDROL  DOSEPAK) 4 MG TBPK tablet Follow package insert 06/22/23   Curatolo, Adam, DO  metoprolol  succinate (TOPROL -XL) 25 MG 24 hr tablet Take 1 tablet (25 mg total) by mouth daily. 07/04/23   Swinyer, Rosaline HERO, NP  predniSONE  (DELTASONE ) 10 MG tablet Two po once a day for 4 days, then one po once a day for 4 days 07/07/23   Bernard Drivers, MD  rosuvastatin  (CRESTOR ) 5 MG tablet Take 5 mg by mouth daily.    [provider]  traMADol  (ULTRAM ) 50 MG tablet Take 50 mg by mouth 2 (two) times daily as needed.    [provider]  vitamin B-12 (CYANOCOBALAMIN ) 1000 MCG tablet Take 1,000 mcg by mouth daily.    [provider]    Allergies: Diltiazem , Prednisone , Amoxicillin, and Tetracyclines & related    Review of Systems  Constitutional:  Negative for chills and fever.  Respiratory:  Negative for cough and shortness of breath.  Cardiovascular:  Negative for chest pain.  Gastrointestinal:  Negative for abdominal pain, diarrhea, nausea and vomiting.    Updated Vital Signs Pulse (!) 44   Temp (!) 97.4 F (36.3 C) (Oral)   Resp 16   Ht 5' 3 (1.6 m)   Wt 47.6 kg   SpO2 100%   BMI 18.60 kg/m   Physical Exam Vitals and nursing note reviewed.  Constitutional:      General: She is not in acute distress.    Appearance: Normal appearance. She is well-developed. She is not diaphoretic.     Comments: Frail elderly female extremely hard of hearing, alert and in no acute distress  HENT:      Head: Normocephalic and atraumatic.     Mouth/Throat:     Mouth: Mucous membranes are moist.     Pharynx: Oropharynx is clear.  Eyes:     General:        Right eye: No discharge.        Left eye: No discharge.  Cardiovascular:     Rate and Rhythm: Regular rhythm. Bradycardia present.     Pulses: Normal pulses.     Heart sounds: Normal heart sounds.  Pulmonary:     Effort: Pulmonary effort is normal. No respiratory distress.     Breath sounds: Normal breath sounds. No wheezing or rales.     Comments: Respirations equal and unlabored, patient able to speak in full sentences, lungs clear to auscultation bilaterally  Abdominal:     General: Bowel sounds are normal. There is no distension.     Palpations: Abdomen is soft. There is no mass.     Tenderness: There is no abdominal tenderness. There is no guarding.     Comments: Abdomen soft, nondistended, nontender to palpation in all quadrants without guarding or peritoneal signs  Musculoskeletal:        General: No deformity.     Cervical back: Neck supple.  Skin:    General: Skin is warm and dry.     Capillary Refill: Capillary refill takes less than 2 seconds.  Neurological:     Mental Status: She is alert and oriented to person, place, and time.     Coordination: Coordination normal.     Comments: Speech is clear, able to follow commands Moves extremities without ataxia, coordination intact  Psychiatric:        Mood and Affect: Mood normal.        Behavior: Behavior normal.     (all labs ordered are listed, but only abnormal results are displayed) Labs Reviewed  CBC WITH DIFFERENTIAL/PLATELET - Abnormal; Notable for the following components:      Result Value   RBC 5.16 (*)    Hemoglobin 16.7 (*)    HCT 49.8 (*)    All other components within normal limits  BASIC METABOLIC PANEL WITH GFR - Abnormal; Notable for the following components:   Potassium 2.9 (*)    Chloride 88 (*)    CO2 36 (*)    Glucose, Bld 110 (*)    BUN  65 (*)    Creatinine, Ser 1.51 (*)    GFR, Estimated 31 (*)    All other components within normal limits  MAGNESIUM  URINALYSIS, ROUTINE W REFLEX MICROSCOPIC    EKG: EKG Interpretation Date/Time:  Thursday June 27 2024 10:33:56 EDT Ventricular Rate:  54 PR Interval:  179 QRS Duration:  96 QT Interval:  436 QTC Calculation: 414 R Axis:   37  Text  Interpretation: Sinus rhythm Supraventricular bigeminy when compared top rior, similar with some new bigeminy. No STEMI Confirmed by Ginger Barefoot ([id number]) on 06/27/2024 11:43:38 AM  Radiology: No results found.   .Critical Care  Performed by: Alva Larraine FALCON, PA-C Authorized by: Alva Larraine FALCON, PA-C   Critical care provider statement:    Critical care time (minutes):  30   Critical care was necessary to treat or prevent imminent or life-threatening deterioration of the following conditions:  Metabolic crisis   Critical care was time spent personally by me on the following activities:  Development of treatment plan with patient or surrogate, discussions with consultants, evaluation of patient's response to treatment, examination of patient, ordering and review of laboratory studies, ordering and review of radiographic studies, ordering and performing treatments and interventions, pulse oximetry, re-evaluation of patient's condition and review of old charts    Medications Ordered in the ED  lactated ringers  bolus 1,000 mL (has no administration in time range)  potassium chloride  10 mEq in 100 mL IVPB (has no administration in time range)  potassium chloride  SA (KLOR-CON  M) CR tablet 40 mEq (has no administration in time range)                                    Medical Decision Making Amount and/or Complexity of Data Reviewed Labs: ordered.  Risk Prescription drug management.   89 year old female presents with abnormal labs.  Called by PCP after noted to have hypokalemia and AKI on recently routine lab work. This  involves an extensive number of treatment options, and is a complaint that carries with it a high risk of complications and morbidity. Recently had HCTZ discontinued.  Daughter also reports very poor p.o. intake at home.  Patient is very hard of hearing and so it is difficult to know if she is experiencing symptoms associated with this.  On arrival she is noted to be bradycardic with some bigeminy on EKG but otherwise hemodynamically stable.  Repeat labs today show a creatinine of 1.51, slightly improved from labs yesterday which showed 1.75, baseline of 0.8-0.9.  Potassium today is 2.9, normal magnesium no other significant electrolyte derangements.  No leukocytosis and slightly elevated hemoglobin suggesting some degree of hemoconcentration.  IV fluids and p.o. and IV potassium replacement ordered.  Given AKI and electrolyte derangements patient will be admitted for further treatment and monitoring.  Consulted for medicine admission and case discussed with Dr. Napoleon with internal medicine teaching service who will see and admit the patient.  Portions of this note were generated with Scientist, clinical (histocompatibility and immunogenetics). Dictation errors may occur despite best attempts at proofreading.     Final diagnoses:  AKI (acute kidney injury)  Hypokalemia    ED Discharge Orders     None          Alva Larraine FALCON DEVONNA 06/27/24 1404    Tegeler, Lonni PARAS, MD 06/27/24 1525

## 2024-06-27 NOTE — H&P (Cosign Needed Addendum)
 Date: 06/27/2024               Patient Name:  Kristie Gillespie MRN: [id number]  DOB: [birth year] Age / Sex: 89 y.o., female   PCP: Pcp, No         Medical Service: Internal Medicine Teaching Service         Attending Physician: Dr. Eben Reyes BROCKS, MD      First Contact: Melvenia Morrison, MD  Pager:  [phone]  Second Contact: Dr. Norman Lobstein, DO  Pager:  [phone]       After Hours  (After 5pm / First Contact Pager: [phone]  weekends / holidays): Second Contact Pager: [phone]   SUBJECTIVE   Chief Complaint: Sent by PCP for AKI  History of Present Illness: Kristie Gillespie is a 89 y.o. female with PMH of HTN, MI >10 years ago, hearing loss, HLD, CKD, a-fib on Eliquis  who presents because of lab abnormalities. She was recently seen in her PCP office on the 23rd and per chart review, had 34 pounds of weight loss in one month. At that visit, Hydrochlorothiazide was discontinued and she was continued on avapro  300mg  daily, Metoprolol  25mg  daily and plendil  5mg  daily. She also reported unsteadiness and a fall. She underwent CT head which showed no bleed later that day. Lab work obtained at the PCP office showed increased serum Creatinine and hypokalemia.  She was seen at bedside and her daughter provided the history, as the patient is unable to hear any part of the conversation due to severe hearing loss. She did not have any pain. She noted that she had not been urinating very frequently today. She has had dwindling PO intake due to loss of appetite, which likely explains her weight loss. She has no nausea, vomiting, diarrhea, or constipation. She does not have any pain and is completely asymptomatic, stating that she is thirsty at this time.  ED Course: Vitals were significant for 138/110 on admission prior to taking any antihypertensives. Pulse low at 55 Received 1L lactated ringers  in the ED, along with 30 mEq potassium IV and 40 mEq PO.  Meds:  Eliquis  5mg  BID Irbesartan   300mg  Hydrochlorothiazide discontinued 3 days ago Potassium packet daily Metoprolol  25mg  daily Rosuvastatin  5mg   Tramadol  50mg  q12 PRN Methocarbamol  q8 PRN Felodipine  5mg  daily Furosemide  20mg  daily B12 supplement   Past Medical History Past Medical History:  Diagnosis Date   HLD (hyperlipidemia)    HTN (hypertension)    MI (myocardial infarction) (HCC) 2015   Managed medically   Skin cancer     Past Surgical History History reviewed. No pertinent surgical history.  Social:  Lives With: daughter upstairs Occupation: retired Support: Family Level of Function: independent PCP: Dole Christopher Podraza with [facility] Substances: -Tobacco: none -Alcohol: none -Recreational Drug: none  Family History:  History reviewed. No pertinent family history.  Allergies: Allergies as of 06/27/2024 - Review Complete 06/27/2024  Allergen Reaction Noted   Diltiazem  Nausea Only 11/24/2021   Prednisone  Other (See Comments) 11/24/2021   Amoxicillin Rash 11/24/2021   Tetracyclines & related Rash 11/24/2021    Review of Systems: A complete ROS was negative except as per HPI.   OBJECTIVE:   Physical Exam: Blood pressure (!) 109/51, pulse (!) 51, temperature (!) 97.4 F (36.3 C), temperature source Oral, resp. rate 13, height 5' 3 (1.6 m), weight 47.6 kg, SpO2 99%.  Constitutional: alert , well-appearing  sitting in , in no acute distress HENT: normocephalic atraumatic, mucous membranes dry. Functionally  deaf Eyes: conjunctiva non-erythematous Neck: supple Cardiovascular: irregular rhythm, no m/r/g Pulmonary/Chest: normal work of breathing on room air, lungs clear to auscultation bilaterally Abdominal: bowel sounds present, soft, non-tender, non-distended MSK: Some muscle wasting present Neurological: alert & oriented x 3, 5/5 strength in bilateral upper and lower extremities Skin: Loss of skin turgor on hands, feet and chest.  Labs: CBC    Component Value Date/Time    WBC 8.2 06/27/2024 1042   RBC 5.16 (H) 06/27/2024 1042   HGB 16.7 (H) 06/27/2024 1042   HCT 49.8 (H) 06/27/2024 1042   PLT 190 06/27/2024 1042   MCV 96.5 06/27/2024 1042   MCH 32.4 06/27/2024 1042   MCHC 33.5 06/27/2024 1042   RDW 13.1 06/27/2024 1042   LYMPHSABS 3.6 06/27/2024 1042   MONOABS 0.8 06/27/2024 1042   EOSABS 0.0 06/27/2024 1042   BASOSABS 0.0 06/27/2024 1042     CMP     Component Value Date/Time   NA 139 06/27/2024 1138   NA 146 (H) 01/17/2022 1145   K 2.9 (L) 06/27/2024 1138   CL 88 (L) 06/27/2024 1138   CO2 36 (H) 06/27/2024 1138   GLUCOSE 110 (H) 06/27/2024 1138   BUN 65 (H) 06/27/2024 1138   BUN 14 01/17/2022 1145   CREATININE 1.51 (H) 06/27/2024 1138   CALCIUM  10.0 06/27/2024 1138   PROT 6.4 (L) 05/05/2022 1000   ALBUMIN 3.9 05/05/2022 1000   AST 22 05/05/2022 1000   ALT 15 05/05/2022 1000   ALKPHOS 89 05/05/2022 1000   BILITOT 1.2 05/05/2022 1000   GFRNONAA 31 (L) 06/27/2024 1138   9/23 Creatinine 1.75 BUN 82 CO2 42 K 2.9  EKG: Supraventricular bigeminy  ASSESSMENT & PLAN:   Assessment & Plan by Problem: Principal Problem:   AKI (acute kidney injury) Active Problems:   Hypokalemia   Kristie Gillespie is a 89 y.o. person living with a history of HTN, MI >10 years ago, hearing loss, HLD, HFpEF, CKD, a-fib on Eliquis , who presented with poor PO intake and AKI and admitted for AKI and hypokalemia.  #AKI on CKD IIIa/b Patient and family reporting poor PO intake and ~30 pounds of weight loss. No acute volume loss. Was at PCP office when routine labs for monitoring kidney function showed Creatinine of 1.75, increased from baseline around 0.9-1.0. BUN elevated at 82. CO2 42. K 2.9. Creatinine improved to 1.51 in the ED today, K still at 2.9, and alkalosis improved to 36. Likely pre-renal AKI in the setting of continued Furosemide  use and decreased PO intake. Given 1L of LR on the ED. Patient noted that she had not been urinating today. BP initially  somewhat soft as low as 87/71 but improved after she received 1L LR. Patient hypovolemic on exam with dry mucus membranes and decreased skin turgor. -Additional 1L LR for 2L total, reassess volume after -BMP tomorrow morning -Urinalysis ordered but patient has not urinated, will obtain bladder scan. -Likely will discontinue Lasix  on discharge, as this seems to be contributing to her AKI and hypovolemia in the setting of poor PO intake. -Holding Irbesartan  in the setting of AKI  #Hypokalemia #Elevated bicarbonate K 2.9 and CO2 36 in the ED. Likely due to poor PO intake and Lasix  as above. Received 40 mEq oral and 30 mEq IV while in the ED.  -BMP tomorrow morning -Replete K as appropriate -D/C Furosemide  on discharge  #Weight loss Per chart review, patient has had 30 pounds of weight loss since April. Patient has had a  decrease in appetite over that time and this weight loss is not very surprising. Will defer further workup for this issue to her PCP.  #Afib #Chronic anticoagulation On Metoprolol  25mg  and Eliquis  5mg  BID for a-fib. Currently bradycardic. Will hold metoprolol  in the setting of Bradycardia. -Hold Metoprolol  -Continue Eliquis  5mg  BID  #Hypertension Home meds Felodipine  5mg , Metoprolol  25mg , Irbesartan  300mg . Just discontinued hydrochlorothiazide 3 days ago. Did not have any BP meds this morning. BP originally soft but improved with volume resuscitation. BP 167/67 -Restart Felodipine  5mg  -PRN Hydralazine  10mg  for SBP >180 -Holding Metoprolol  in the setting of Bradycardia -Holding Irbesartan  300mg  in the setting of AKI  #History of MI History of MI over 10 years ago, no stents or CABG -Continue home aspirin  81mg  and Rosuvastatin  5mg   #HLD -Continue home Rosuvastatin  5mg   #HFpEF EF in 2023 60-65% with grade 2 diastolic dysfunction. Per chart review, has not had exacerbation since 2023. -Hold Lasix  as above  #Hearing loss Daughter provides history due to severe  hearing loss. Written communication required if trying to communicate with patient.   Diet: Heart Healthy VTE: on Eliquis  5mg  BID IVF: LR,Bolus Code: Full Surrogate Decision Maker: Daughter, number in chart  Prior to Admission Living Arrangement: Home, living with daughter Anticipated Discharge Location: Home Barriers to Discharge: AKI  Dispo: Admit patient to Observation with expected length of stay less than 2 midnights.  Signed: Napoleon Limes, MD Internal Medicine Resident PGY-1 06/27/2024, 5:33 PM   Please contact IM Residency On-Call Pager at: [phone] or [phone].

## 2024-06-27 NOTE — Progress Notes (Signed)
Patient arrived to room. Alert and oriented x4. VSS. Bed alarm on and call bell within reach.

## 2024-06-28 ENCOUNTER — Other Ambulatory Visit (HOSPITAL_COMMUNITY): Payer: Self-pay

## 2024-06-28 DIAGNOSIS — N179 Acute kidney failure, unspecified: Secondary | ICD-10-CM | POA: Diagnosis not present

## 2024-06-28 DIAGNOSIS — I252 Old myocardial infarction: Secondary | ICD-10-CM

## 2024-06-28 DIAGNOSIS — E876 Hypokalemia: Secondary | ICD-10-CM | POA: Diagnosis not present

## 2024-06-28 DIAGNOSIS — I5030 Unspecified diastolic (congestive) heart failure: Secondary | ICD-10-CM | POA: Diagnosis not present

## 2024-06-28 DIAGNOSIS — I130 Hypertensive heart and chronic kidney disease with heart failure and stage 1 through stage 4 chronic kidney disease, or unspecified chronic kidney disease: Secondary | ICD-10-CM | POA: Diagnosis not present

## 2024-06-28 LAB — CBC
HCT: 40.3 % (ref 36.0–46.0)
Hemoglobin: 13.7 g/dL (ref 12.0–15.0)
MCH: 32.9 pg (ref 26.0–34.0)
MCHC: 34 g/dL (ref 30.0–36.0)
MCV: 96.6 fL (ref 80.0–100.0)
Platelets: 169 K/uL (ref 150–400)
RBC: 4.17 MIL/uL (ref 3.87–5.11)
RDW: 12.7 % (ref 11.5–15.5)
WBC: 8.8 K/uL (ref 4.0–10.5)
nRBC: 0 % (ref 0.0–0.2)

## 2024-06-28 LAB — URINALYSIS, ROUTINE W REFLEX MICROSCOPIC
Bacteria, UA: NONE SEEN
Bilirubin Urine: NEGATIVE
Glucose, UA: NEGATIVE mg/dL
Hgb urine dipstick: NEGATIVE
Ketones, ur: 5 mg/dL — AB
Nitrite: NEGATIVE
Protein, ur: NEGATIVE mg/dL
Specific Gravity, Urine: 1.012 (ref 1.005–1.030)
pH: 8 (ref 5.0–8.0)

## 2024-06-28 LAB — BASIC METABOLIC PANEL WITH GFR
Anion gap: 11 (ref 5–15)
Anion gap: 13 (ref 5–15)
BUN: 49 mg/dL — ABNORMAL HIGH (ref 8–23)
BUN: 38 mg/dL — ABNORMAL HIGH (ref 8–23)
CO2: 33 mmol/L — ABNORMAL HIGH (ref 22–32)
CO2: 26 mmol/L (ref 22–32)
Calcium: 9.5 mg/dL (ref 8.9–10.3)
Calcium: 9.4 mg/dL (ref 8.9–10.3)
Chloride: 96 mmol/L — ABNORMAL LOW (ref 98–111)
Chloride: 99 mmol/L (ref 98–111)
Creatinine, Ser: 1.32 mg/dL — ABNORMAL HIGH (ref 0.44–1.00)
Creatinine, Ser: 1.06 mg/dL — ABNORMAL HIGH (ref 0.44–1.00)
GFR, Estimated: 37 mL/min — ABNORMAL LOW (ref >60)
GFR, Estimated: 48 mL/min — ABNORMAL LOW (ref >60)
Glucose, Bld: 111 mg/dL — ABNORMAL HIGH (ref 70–99)
Glucose, Bld: 107 mg/dL — ABNORMAL HIGH (ref 70–99)
Potassium: 4.3 mmol/L (ref 3.5–5.1)
Potassium: 4.1 mmol/L (ref 3.5–5.1)
Sodium: 140 mmol/L (ref 135–145)
Sodium: 138 mmol/L (ref 135–145)

## 2024-06-28 MED ORDER — LACTATED RINGERS IV BOLUS
500.0000 mL | Freq: Once | INTRAVENOUS | Status: AC
  Administered 2024-06-28: 500 mL via INTRAVENOUS

## 2024-06-28 MED ORDER — APIXABAN 2.5 MG PO TABS
2.5000 mg | ORAL_TABLET | Freq: Two times a day (BID) | ORAL | 0 refills | Status: AC
  Filled 2024-06-28: qty 60, 30d supply, fill #0

## 2024-06-28 NOTE — Discharge Instructions (Signed)
 You were hospitalized for acute kidney injury and dehydration. Thank you for allowing us  to be part of your care. Please make sure to drink plenty of water as you were dehydrated when you first came in.  Please arrange hospital follow-up with:  Your primary care doctor, Rosalva Bruckner Podraza with [facility]  Please note these changes made to your medications:   *Please STOP taking:  -Furosemide  (Lasix ) -Potassium  *Please wait to take your Irbesartan  and your Metoprolol  until you see your primary care provider.  We have decreased the dose of your Eliquis  to 2.5mg  twice daily. Please do not take th 5.0 mg pills.  *Please CONTINUE taking:  -Felodipine  -Rosuvastatin  -Methocarbamol  -B12 -Acetaminophen .

## 2024-06-28 NOTE — Progress Notes (Signed)
 IV removed by patient-CDI. Reviewed d/c paperwork with patient and daughter. Answered questions. NT wheeled stable patient and belongings with daughter to [facility] pharmacy and then to main entrance.

## 2024-06-28 NOTE — Discharge Summary (Signed)
 Name: Kristie Gillespie MRN: [id number] DOB: [birth year] 89 y.o. PCP: Pcp, No  Date of Admission: 06/27/2024 10:23 AM Date of Discharge: 9/26 Attending Physician: Dr. Reyes Fenton  Discharge Diagnosis: 1. Principal Problem:   AKI (acute kidney injury) Active Problems:   Hypokalemia    Discharge Medications: Allergies as of 06/28/2024       Reactions   Diltiazem  Nausea Only   Prednisone  Other (See Comments)   Unknown reaction   Amoxicillin Rash   Tetracyclines & Related Rash        Medication List     PAUSE taking these medications    irbesartan  300 MG tablet Wait to take this until your doctor or other care provider tells you to start again. Commonly known as: AVAPRO  Take 300 mg by mouth daily.   metoprolol  succinate 25 MG 24 hr tablet Wait to take this until your doctor or other care provider tells you to start again. Commonly known as: TOPROL -XL Take 1 tablet (25 mg total) by mouth daily.       STOP taking these medications    furosemide  20 MG tablet Commonly known as: LASIX    Klor-Con  20 MEQ packet Generic drug: potassium chloride    methocarbamol  500 MG tablet Commonly known as: ROBAXIN        TAKE these medications    acetaminophen  650 MG CR tablet Commonly known as: TYLENOL  Take 650 mg by mouth at bedtime.   CORICIDIN HBP PO Take 1 tablet by mouth every morning.   cyanocobalamin  1000 MCG tablet Commonly known as: VITAMIN B12 Take 1,000 mcg by mouth daily.   Eliquis  2.5 MG Tabs tablet Generic drug: apixaban  Take 1 tablet (2.5 mg total) by mouth 2 (two) times daily. What changed:  medication strength how much to take additional instructions   felodipine  5 MG 24 hr tablet Commonly known as: PLENDIL  Take 5 mg by mouth daily.   rosuvastatin  5 MG tablet Commonly known as: CRESTOR  Take 5 mg by mouth daily.        Disposition and follow-up:   Kristie Gillespie was discharged from [facility] in Good  condition.  At the hospital follow up visit please address:  1.  AKI on CKD IIIa/b Likely due to poor PO intake and Lasix  use. Lasix  held at discharge due to concern for over diuresis. Creatinine improved to 1.06 prior to discharge. Consider BMP to evaluate Creatinine, restart Irbesartan  if appropriate.  2. Hypokalemia Potassium supplementation while she was admitted. Lasix  held. Outpatient potassium held at discharge. Consider BMP to evaluate potassium level and necessity of supplementation  3. Weight loss  Evaluate weight loss as indicated and consistent with patients goals of care  4. A-Fib  Metoprolol  held due to bradycardia. Eliquis  dose decreased to 2.5 mg BID. Restart Metoprolol  if appropriate  5. HTN Metoprolol  held for bradycardia, Irbesartan  held for AKI. On Felodipine  5mg  Assess patient's BP and necessity for these medications. Restart if necessary.  2.  Labs / imaging needed at time of follow-up: BMP  3.  Pending labs/ test needing follow-up: None  Follow-up Appointments:  Follow-up Information     Kristie Kristie Bruckner, PA-C. Schedule an appointment as soon as possible for a visit in 1 week(s).   Specialty: Physician Assistant Why: Call your PCP to make an appointment to discuss your medications Contact information: [id number] [place] [address] [phone]                  [facility]  Course by problem list: Kristie Gillespie is a 89 y.o. person living with a history of HTN, MI >10 years ago, hearing loss, HLD, HFpEF, CKD, a-fib on Eliquis , who presented with poor PO intake and AKI and admitted for AKI and hypokalemia.   #AKI on CKD IIIa/b Patient and family reporting poor PO intake and ~30 pounds of weight loss. No acute volume loss. Was at PCP office when routine labs for monitoring kidney function showed Creatinine of 1.75, increased from baseline around 0.9-1.0. BUN elevated at 82. CO2 42. K 2.9. Likely pre-renal AKI in the setting  of continued Furosemide  use and decreased PO intake. Exam showed decreased skin turgor. Given 2.5L of LR total. BP initially somewhat soft as low as 87/71 but improved after she received LR. Serum Creatinine decreased to 1.06 on day of discharge. CO2 improved to 26. K improved to 4.1 with supplementation. Urinalysis with trace leukocytes but no other signs of infection. Her Irbesartan  was held in the setting of her AKI and can likely be resumed if needed at PCP follow-up. Will discontinue the patient's Lasix  at this time as the patients history and lab abnormalities are consistent with over diuresis.    #Hypokalemia #Elevated bicarbonate K 2.9 and CO2 36 in the ED. Likely due to poor PO intake and Lasix  as above. Received both IV and oral potassium supplementation throughout her stay. K improved to 4.1 and CO2 improved to 26 prior to discharge. D/C Furosemide    #Weight loss Per chart review, patient has had 30 pounds of weight loss since April. Patient has had a decrease in appetite over that time and this weight loss is not very surprising. Will defer further workup for this issue to her PCP.   #Afib #Chronic anticoagulation On Metoprolol  25mg  and Eliquis  5mg  BID for a-fib. Currently bradycardic. Will hold metoprolol  in the setting of Bradycardia. Eliquis  decreased to 2.5mg  BID due to her low body weight.   #Hypertension Home meds Felodipine  5mg , Metoprolol  25mg , Irbesartan  300mg . Just discontinued hydrochlorothiazide 3 days prior to admission. Did not have any BP meds this morning. BP originally soft but improved with volume resuscitation. BP 167/67 prior to discharge. Will hold Irbesartan  for AKI and Metoprolol  for bradycardia. Continue Felodipine .   #History of MI History of MI over 10 years ago, no stents or CABG -Continue home aspirin  81mg  and Rosuvastatin  5mg    #HLD -Continue home Rosuvastatin  5mg    #HFpEF EF in 2023 60-65% with grade 2 diastolic dysfunction. Per chart review, has not  had exacerbation since 2023. -Hold Lasix  as above   #Hearing loss Daughter provides history due to severe hearing loss. Written communication throughout patient's stay.   Subjective Patient feeling well. Was able to tolerate tomato soup and has been drinking water. No complaints today.  Discharge Exam:   BP (!) 173/77 (BP Location: Right Arm)   Pulse 68   Temp (!) 97.4 F (36.3 C) (Oral)   Resp 18   Ht 5' 3 (1.6 m)   Wt 49.4 kg   SpO2 100%   BMI 19.29 kg/m  Discharge exam:  Physical Exam Constitutional:      General: She is not in acute distress.    Appearance: She is not ill-appearing.  HENT:     Ears:     Comments: Impaired hearing     Mouth/Throat:     Mouth: Mucous membranes are moist.  Eyes:     Extraocular Movements: Extraocular movements intact.  Cardiovascular:     Rate and Rhythm:  Normal rate and regular rhythm.  Pulmonary:     Effort: Pulmonary effort is normal. No respiratory distress.  Abdominal:     General: Bowel sounds are normal. There is no distension.     Tenderness: There is no abdominal tenderness.  Musculoskeletal:        General: No swelling.  Skin:    Findings: Bruising present.     Comments: Improved skin turgor, still with some wrinkles  Neurological:     Mental Status: She is alert.      Pertinent Labs, Studies, and Procedures:     Latest Ref Rng & Units 06/28/2024    3:35 AM 06/27/2024   10:42 AM 05/05/2022   10:00 AM  CBC  WBC 4.0 - 10.5 K/uL 8.8  8.2  6.2   Hemoglobin 12.0 - 15.0 g/dL 86.2  83.2  85.9   Hematocrit 36.0 - 46.0 % 40.3  49.8  45.6   Platelets 150 - 400 K/uL 169  190  160        Latest Ref Rng & Units 06/28/2024   12:11 PM 06/28/2024    3:35 AM 06/27/2024    7:18 PM  CMP  Glucose 70 - 99 mg/dL 892  888  868   BUN 8 - 23 mg/dL 38  49  51   Creatinine 0.44 - 1.00 mg/dL 8.93  8.67  8.62   Sodium 135 - 145 mmol/L 138  140  138   Potassium 3.5 - 5.1 mmol/L 4.1  4.3  3.4   Chloride 98 - 111 mmol/L 99  96  93    CO2 22 - 32 mmol/L 26  33  34   Calcium  8.9 - 10.3 mg/dL 9.4  9.5  9.6     Discharge Instructions: Discharge Instructions     Call MD for:  difficulty breathing, headache or visual disturbances   Complete by: As directed    Call MD for:  persistant dizziness or light-headedness   Complete by: As directed    Call MD for:  temperature >100.4   Complete by: As directed    Diet - low sodium heart healthy   Complete by: As directed    Discharge instructions   Complete by: As directed    You were hospitalized for acute kidney injury and dehydration. Thank you for allowing us  to be part of your care. Please make sure to drink plenty of water as you were dehydrated when you first came in.  Please arrange hospital follow-up with:  Your primary care doctor, Kristie Gillespie with [facility]  Please note these changes made to your medications:   *Please STOP taking:  -Furosemide  (Lasix ) -Potassium  *Please wait to take your Irbesartan  and your Metoprolol  until you see your primary care provider.  We have decreased the dose of your Eliquis  to 2.5mg  twice daily. Please do not take th 5.0 mg pills.  *Please CONTINUE taking:  -Felodipine  -Rosuvastatin  -Methocarbamol  -B12 -Acetaminophen .   Increase activity slowly   Complete by: As directed        Signed: Napoleon Limes, MD 06/28/2024, 3:05 PM

## 2024-06-28 NOTE — Evaluation (Signed)
 Physical Therapy Evaluation Patient Details Name: Kristie Gillespie MRN: [id number] DOB: 05/03/1926 Today's Date: 06/28/2024  History of Present Illness  Pt is a 89 y/o F admitted on 06/27/24 after presenting with lab abnormalities (increased creatinine, hypokalemia). 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 PMH: HTN, MI, hearing loss, HLD, CKD, a-fib on Eliquis , skin CA  Clinical Impression  Pt seen for PT evaluation with pt agreeable, daughter present in room. Daughter provides PLOF, home set up information 2/2 pt is [facility] (uses written communication at baseline). Pt is able to complete bed mobility with mod I, sit>stand with supervision, & ambulate without AD with CGA. Daughter reports pt's gait is close to baseline. Will continue to follow pt acutely to progress gait & for stair negotiation.        If plan is discharge home, recommend the following: A little help with walking and/or transfers;A little help with bathing/dressing/bathroom;Assistance with cooking/housework;Assist for transportation;Help with stairs or ramp for entrance   Can travel by private vehicle        Equipment Recommendations None recommended by PT  Recommendations for Other Services       Functional Status Assessment Patient has had a recent decline in their functional status and demonstrates the ability to make significant improvements in function in a reasonable and predictable amount of time.     Precautions / Restrictions Precautions Precautions: Fall Precaution/Restrictions Comments: HOH (use written communication) Restrictions Weight Bearing Restrictions Per Provider Order: No      Mobility  Bed Mobility Overal bed mobility: Needs Assistance Bed Mobility: Supine to Sit, Sit to Supine     Supine to sit: Modified independent (Device/Increase time), HOB elevated, Used rails (exit R side of bed) Sit to supine: Modified independent (Device/Increase time), HOB elevated, Used rails         Transfers Overall transfer level: Needs assistance Equipment used: None Transfers: Sit to/from Stand Sit to Stand: Supervision                Ambulation/Gait Ambulation/Gait assistance: Contact guard assist Gait Distance (Feet): 60 Feet Assistive device: None Gait Pattern/deviations: Decreased step length - left, Decreased step length - right, Decreased dorsiflexion - left, Decreased dorsiflexion - right, Decreased stride length, Shuffle Gait velocity: decreased        Stairs            Wheelchair Mobility     Tilt Bed    Modified Rankin (Stroke Patients Only)       Balance Overall balance assessment: Needs assistance Sitting-balance support: Feet supported Sitting balance-Leahy Scale: Good     Standing balance support: During functional activity, No upper extremity supported Standing balance-Leahy Scale: Fair                               Pertinent Vitals/Pain Pain Assessment Pain Assessment: No/denies pain    Home Living Family/patient expects to be discharged to:: Private residence Living Arrangements: Children Available Help at Discharge: Family;Available 24 hours/day;Personal care attendant Type of Home: House (towhome) Home Access: Level entry     Alternate Level Stairs-Number of Steps: flight Home Layout: Two level;Bed/bath upstairs Home Equipment: Rolling Walker (2 wheels);Cane - single point;Shower seat Additional Comments: hardly ever comes downstairs, pt has coffee pot, microwave, & mini fridge in her room    Prior Function               Mobility Comments: ambulatory without AD, household  distances, hardly ever leaves her room, 1 fall in the past 6 months (bruising noted to L forehead) ADLs Comments: bathes & dresses without assistance, daughter assists with med management & cooking, has PCA to also assist with meal prep     Extremity/Trunk Assessment   Upper Extremity Assessment Upper Extremity  Assessment: Overall WFL for tasks assessed    Lower Extremity Assessment Lower Extremity Assessment: Generalized weakness    Cervical / Trunk Assessment Cervical / Trunk Assessment: Kyphotic  Communication   Communication Communication: Impaired Factors Affecting Communication: Hearing impaired (use written communication (daughter uses this at baseline))    Cognition Arousal: Alert Behavior During Therapy: WFL for tasks assessed/performed   PT - Cognitive impairments: No apparent impairments                         Following commands: Intact (limited by [facility])       Cueing Cueing Techniques: Gestural cues, Verbal cues, Visual cues, Tactile cues     General Comments General comments (skin integrity, edema, etc.): HR 103 bpm with gait    Exercises     Assessment/Plan    PT Assessment Patient needs continued PT services  PT Problem List Decreased balance;Decreased mobility;Decreased activity tolerance;Decreased knowledge of use of DME;Decreased range of motion;Decreased strength       PT Treatment Interventions Balance training;DME instruction;Stair training;Gait training;Neuromuscular re-education;Functional mobility training;Patient/family education;Therapeutic exercise;Therapeutic activities    PT Goals (Current goals can be found in the Care Plan section)  Acute Rehab PT Goals Patient Stated Goal: get better, go home PT Goal Formulation: With patient/family Time For Goal Achievement: 07/12/24 Potential to Achieve Goals: Good    Frequency Min 2X/week     Co-evaluation               AM-PAC PT 6 Clicks Mobility  Outcome Measure Help needed turning from your back to your side while in a flat bed without using bedrails?: None Help needed moving from lying on your back to sitting on the side of a flat bed without using bedrails?: A Little Help needed moving to and from a bed to a chair (including a wheelchair)?: A Little Help needed standing up  from a chair using your arms (e.g., wheelchair or bedside chair)?: A Little Help needed to walk in hospital room?: A Little Help needed climbing 3-5 steps with a railing? : A Little 6 Click Score: 19    End of Session   Activity Tolerance: Patient tolerated treatment well Patient left: in bed;with call bell/phone within reach;with bed alarm set;with family/visitor present   PT Visit Diagnosis: Unsteadiness on feet (R26.81);Muscle weakness (generalized) (M62.81)    Time: 8996-8982 PT Time Calculation (min) (ACUTE ONLY): 14 min   Charges:   PT Evaluation $PT Eval Low Complexity: 1 Low   PT General Charges $$ ACUTE PT VISIT: 1 Visit         Richerd Pinal, PT, DPT 06/28/24, 10:27 AM   Richerd CHRISTELLA Pinal 06/28/2024, 10:26 AM

## 2024-06-28 NOTE — Plan of Care (Signed)

## 2024-07-01 ENCOUNTER — Other Ambulatory Visit (HOSPITAL_COMMUNITY): Payer: Self-pay

## 2024-07-02 NOTE — Care Management Obs Status (Signed)
 MEDICARE OBSERVATION STATUS NOTIFICATION   Patient Details  Name: Kristie Gillespie MRN: [id number] Date of Birth: [birth year]   Medicare Observation Status Notification Given:  Yes Obs letter signed and copy mail as patient has discharged    [facility] 07/02/2024, 9:35 AM
